# Patient Record
Sex: Female | Born: 1980 | Race: White | Hispanic: No | Marital: Married | Smoking: Never smoker
Health system: Southern US, Community
[De-identification: ages and names within clinical notes are randomized; demographics above are authoritative.]

## PROBLEM LIST (undated history)

## (undated) ENCOUNTER — Inpatient Hospital Stay (HOSPITAL_COMMUNITY): Payer: Self-pay

## (undated) DIAGNOSIS — R51 Headache: Secondary | ICD-10-CM

## (undated) DIAGNOSIS — Z8742 Personal history of other diseases of the female genital tract: Secondary | ICD-10-CM

## (undated) DIAGNOSIS — K219 Gastro-esophageal reflux disease without esophagitis: Secondary | ICD-10-CM

## (undated) DIAGNOSIS — J45909 Unspecified asthma, uncomplicated: Secondary | ICD-10-CM

## (undated) DIAGNOSIS — R519 Headache, unspecified: Secondary | ICD-10-CM

## (undated) DIAGNOSIS — I10 Essential (primary) hypertension: Secondary | ICD-10-CM

## (undated) DIAGNOSIS — E039 Hypothyroidism, unspecified: Secondary | ICD-10-CM

## (undated) DIAGNOSIS — F419 Anxiety disorder, unspecified: Secondary | ICD-10-CM

## (undated) HISTORY — PX: TYMPANOSTOMY TUBE PLACEMENT: SHX32

---

## 2013-08-28 ENCOUNTER — Other Ambulatory Visit: Payer: Self-pay | Admitting: Family Medicine

## 2013-08-28 DIAGNOSIS — R109 Unspecified abdominal pain: Secondary | ICD-10-CM

## 2013-09-03 ENCOUNTER — Other Ambulatory Visit: Payer: Self-pay

## 2013-09-20 ENCOUNTER — Ambulatory Visit
Admission: RE | Admit: 2013-09-20 | Discharge: 2013-09-20 | Disposition: A | Discharge status: Home / Self Care | Payer: 59 | Source: Ambulatory Visit | Attending: Family Medicine | Admitting: Family Medicine

## 2013-09-20 ENCOUNTER — Other Ambulatory Visit: Payer: Self-pay | Admitting: Family Medicine

## 2013-09-20 DIAGNOSIS — R319 Hematuria, unspecified: Secondary | ICD-10-CM

## 2013-09-20 DIAGNOSIS — R109 Unspecified abdominal pain: Secondary | ICD-10-CM

## 2014-10-23 ENCOUNTER — Other Ambulatory Visit: Payer: Self-pay | Admitting: Family Medicine

## 2014-10-23 DIAGNOSIS — M25561 Pain in right knee: Secondary | ICD-10-CM

## 2014-11-06 ENCOUNTER — Other Ambulatory Visit: Payer: Self-pay

## 2014-11-19 ENCOUNTER — Inpatient Hospital Stay: Admission: RE | Admit: 2014-11-19 | Payer: Self-pay | Source: Ambulatory Visit

## 2015-10-31 ENCOUNTER — Ambulatory Visit (HOSPITAL_COMMUNITY)
Admission: EM | Admit: 2015-10-31 | Discharge: 2015-10-31 | Disposition: A | Discharge status: Home / Self Care | Payer: Managed Care, Other (non HMO) | Source: Home / Self Care | Attending: Emergency Medicine | Admitting: Emergency Medicine

## 2015-10-31 ENCOUNTER — Encounter (HOSPITAL_COMMUNITY): Payer: Self-pay | Admitting: Emergency Medicine

## 2015-10-31 ENCOUNTER — Emergency Department (HOSPITAL_COMMUNITY)
Admission: EM | Admit: 2015-10-31 | Discharge: 2015-10-31 | Disposition: A | Discharge status: Home / Self Care | Payer: Managed Care, Other (non HMO) | Attending: Emergency Medicine | Admitting: Emergency Medicine

## 2015-10-31 DIAGNOSIS — M79662 Pain in left lower leg: Secondary | ICD-10-CM | POA: Diagnosis present

## 2015-10-31 DIAGNOSIS — I8392 Asymptomatic varicose veins of left lower extremity: Secondary | ICD-10-CM | POA: Insufficient documentation

## 2015-10-31 DIAGNOSIS — I8393 Asymptomatic varicose veins of bilateral lower extremities: Secondary | ICD-10-CM

## 2015-10-31 DIAGNOSIS — I8390 Asymptomatic varicose veins of unspecified lower extremity: Secondary | ICD-10-CM

## 2015-10-31 DIAGNOSIS — S86812A Strain of other muscle(s) and tendon(s) at lower leg level, left leg, initial encounter: Secondary | ICD-10-CM

## 2015-10-31 DIAGNOSIS — L550 Sunburn of first degree: Secondary | ICD-10-CM | POA: Insufficient documentation

## 2015-10-31 DIAGNOSIS — R2242 Localized swelling, mass and lump, left lower limb: Secondary | ICD-10-CM | POA: Diagnosis not present

## 2015-10-31 DIAGNOSIS — M7989 Other specified soft tissue disorders: Secondary | ICD-10-CM

## 2015-10-31 LAB — I-STAT CHEM 8, ED
BUN: 16 mg/dL (ref 6–20)
Calcium, Ion: 1.21 mmol/L (ref 1.12–1.23)
Chloride: 103 mmol/L (ref 101–111)
Creatinine, Ser: 0.9 mg/dL (ref 0.44–1.00)
Glucose, Bld: 93 mg/dL (ref 65–99)
HEMATOCRIT: 39 % (ref 36.0–46.0)
HEMOGLOBIN: 13.3 g/dL (ref 12.0–15.0)
Potassium: 3.9 mmol/L (ref 3.5–5.1)
SODIUM: 140 mmol/L (ref 135–145)
TCO2: 24 mmol/L (ref 0–100)

## 2015-10-31 LAB — POCT PREGNANCY, URINE: PREG TEST UR: NEGATIVE

## 2015-10-31 MED ORDER — ENOXAPARIN SODIUM 120 MG/0.8ML ~~LOC~~ SOLN
1.0000 mg/kg | Freq: Once | SUBCUTANEOUS | Status: AC
  Administered 2015-10-31: 115 mg via SUBCUTANEOUS
  Filled 2015-10-31: qty 0.8

## 2015-10-31 MED ORDER — IBUPROFEN 400 MG PO TABS
800.0000 mg | ORAL_TABLET | Freq: Once | ORAL | Status: AC
  Administered 2015-10-31: 800 mg via ORAL
  Filled 2015-10-31: qty 2

## 2015-10-31 MED ORDER — OXYCODONE-ACETAMINOPHEN 5-325 MG PO TABS: 1.0000 | ORAL_TABLET | ORAL | Status: DC | PRN

## 2015-10-31 MED ORDER — OXYCODONE-ACETAMINOPHEN 5-325 MG PO TABS
2.0000 | ORAL_TABLET | Freq: Once | ORAL | Status: AC
  Administered 2015-10-31: 2 via ORAL
  Filled 2015-10-31: qty 2

## 2015-10-31 NOTE — Discharge Instructions (Signed)
Deep Vein Thrombosis °A deep vein thrombosis (DVT) is a blood clot (thrombus) that usually occurs in a deep, larger vein of the lower leg or the pelvis, or in an upper extremity such as the arm. These are dangerous and can lead to serious and even life-threatening complications if the clot travels to the lungs. °A DVT can damage the valves in your leg veins so that instead of flowing upward, the blood pools in the lower leg. This is called post-thrombotic syndrome, and it can result in pain, swelling, discoloration, and sores on the leg. °CAUSES °A DVT is caused by the formation of a blood clot in your leg, pelvis, or arm. Usually, several things contribute to the formation of blood clots. A clot may develop when: °· Your blood flow slows down. °· Your vein becomes damaged in some way. °· You have a condition that makes your blood clot more easily. °RISK FACTORS °A DVT is more likely to develop in: °· People who are older, especially over 60 years of age. °· People who are overweight (obese). °· People who sit or lie still for a long time, such as during long-distance travel (over 4 hours), bed rest, hospitalization, or during recovery from certain medical conditions like a stroke. °· People who do not engage in much physical activity (sedentary lifestyle). °· People who have chronic breathing disorders. °· People who have a personal or family history of blood clots or blood clotting disease. °· People who have peripheral vascular disease (PVD), diabetes, or some types of cancer. °· People who have heart disease, especially if the person had a recent heart attack or has congestive heart failure. °· People who have neurological diseases that affect the legs (leg paresis). °· People who have had a traumatic injury, such as breaking a hip or leg. °· People who have recently had major or lengthy surgery, especially on the hip, knee, or abdomen. °· People who have had a central line placed inside a large vein. °· People  who take medicines that contain the hormone estrogen. These include birth control pills and hormone replacement therapy. °· Pregnancy or during childbirth or the postpartum period. °· Long plane flights (over 8 hours). °SIGNS AND SYMPTOMS °Symptoms of a DVT can include:  °· Swelling of your leg or arm, especially if one side is much worse. °· Warmth and redness of your leg or arm, especially if one side is much worse. °· Pain in your arm or leg. If the clot is in your leg, symptoms may be more noticeable or worse when you stand or walk. °· A feeling of pins and needles, if the clot is in the arm. °The symptoms of a DVT that has traveled to the lungs (pulmonary embolism, PE) usually start suddenly and include: °· Shortness of breath while active or at rest. °· Coughing or coughing up blood or blood-tinged mucus. °· Chest pain that is often worse with deep breaths. °· Rapid or irregular heartbeat. °· Feeling light-headed or dizzy. °· Fainting. °· Feeling anxious. °· Sweating. °There may also be pain and swelling in a leg if that is where the blood clot started. °These symptoms may represent a serious problem that is an emergency. Do not wait to see if the symptoms will go away. Get medical help right away. Call your local emergency services (911 in the [place] Do not drive yourself to the hospital. °DIAGNOSIS °Your health care provider will take a medical history and perform a physical exam. You may also   have other tests, including: °· Blood tests to assess the clotting properties of your blood. °· Imaging tests, such as CT, ultrasound, MRI, X-ray, and other tests to see if you have clots anywhere in your body. °TREATMENT °After a DVT is identified, it can be treated. The type of treatment that you receive depends on many factors, such as the cause of your DVT, your risk for bleeding or developing more clots, and other medical conditions that you have. Sometimes, a combination of treatments is necessary. Treatment  options may be combined and include: °· Monitoring the blood clot with ultrasound. °· Taking medicines by mouth, such as newer blood thinners (anticoagulants), thrombolytics, or warfarin. °· Taking anticoagulant medicine by injection or through an IV tube. °· Wearing compression stockings or using different types of devices. °· Surgery (rare) to remove the blood clot or to place a filter in your abdomen to stop the blood clot from traveling to your lungs. °Treatments for a DVT are often divided into immediate treatment and long-term treatment (up to 3 months after DVT). You can work with your health care provider to choose the treatment program that is best for you. °HOME CARE INSTRUCTIONS °If you are taking a newer oral anticoagulant: °· Take the medicine every single day at the same time each day. °· Understand what foods and drugs interact with this medicine. °· Understand that there are no regular blood tests required when using this medicine. °· Understand the side effects of this medicine, including excessive bruising or bleeding. Ask your health care provider or pharmacist about other possible side effects. °If you are taking warfarin: °· Understand how to take warfarin and know which foods can affect how warfarin works in your body. °· Understand that it is dangerous to take too much or too little warfarin. Too much warfarin increases the risk of bleeding. Too little warfarin continues to allow the risk for blood clots. °· Follow your PT and INR blood testing schedule. The PT and INR results allow your health care provider to adjust your dose of warfarin. It is very important that you have your PT and INR tested as often as told by your health care provider. °· Avoid major changes in your diet, or tell your health care provider before you change your diet. Arrange a visit with a registered dietitian to answer your questions. Many foods, especially foods that are high in vitamin K, can interfere with warfarin  and affect the PT and INR results. Eat a consistent amount of foods that are high in vitamin K, such as: °¨ Spinach, kale, broccoli, cabbage, collard greens, turnip greens, Brussels sprouts, peas, cauliflower, seaweed, and parsley. °¨ Beef liver and pork liver. °¨ Green tea. °¨ Soybean oil. °· Tell your health care provider about any and all medicines, vitamins, and supplements that you take, including aspirin and other over-the-counter anti-inflammatory medicines. Be especially cautious with aspirin and anti-inflammatory medicines. Do not take those before you ask your health care provider if it is safe to do so. This is important because many medicines can interfere with warfarin and affect the PT and INR results. °· Do not start or stop taking any over-the-counter or prescription medicine unless your health care provider or pharmacist tells you to do so. °If you take warfarin, you will also need to do these things: °· Hold pressure over cuts for longer than usual. °· Tell your dentist and other health care providers that you are taking warfarin before you have any procedures in which   bleeding may occur. °· Avoid alcohol or drink very small amounts. Tell your health care provider if you change your alcohol intake. °· Do not use tobacco products, including cigarettes, chewing tobacco, and e-cigarettes. If you need help quitting, ask your health care provider. °· Avoid contact sports. °General Instructions °· Take over-the-counter and prescription medicines only as told by your health care provider. Anticoagulant medicines can have side effects, including easy bruising and difficulty stopping bleeding. If you are prescribed an anticoagulant, you will also need to do these things: °¨ Hold pressure over cuts for longer than usual. °¨ Tell your dentist and other health care providers that you are taking anticoagulants before you have any procedures in which bleeding may occur. °¨ Avoid contact sports. °· Wear a medical  alert bracelet or carry a medical alert card that says you have had a PE. °· Ask your health care provider how soon you can go back to your normal activities. Stay active to prevent new blood clots from forming. °· Make sure to exercise while traveling or when you have been sitting or standing for a long period of time. It is very important to exercise. Exercise your legs by walking or by tightening and relaxing your leg muscles often. Take frequent walks. °· Wear compression stockings as told by your health care provider to help prevent more blood clots from forming. °· Do not use tobacco products, including cigarettes, chewing tobacco, and e-cigarettes. If you need help quitting, ask your health care provider. °· Keep all follow-up appointments with your health care provider. This is important. °PREVENTION °Take these actions to decrease your risk of developing another DVT: °· Exercise regularly. For at least 30 minutes every day, engage in: °¨ Activity that involves moving your arms and legs. °¨ Activity that encourages good blood flow through your body by increasing your heart rate. °· Exercise your arms and legs every hour during long-distance travel (over 4 hours). Drink plenty of water and avoid drinking alcohol while traveling. °· Avoid sitting or lying in bed for long periods of time without moving your legs. °· Maintain a weight that is appropriate for your height. Ask your health care provider what weight is healthy for you. °· If you are a woman who is over 35 years of age, avoid unnecessary use of medicines that contain estrogen. These include birth control pills. °· Do not smoke, especially if you take estrogen medicines. If you need help quitting, ask your health care provider. °If you are hospitalized, prevention measures may include: °· Early walking after surgery, as soon as your health care provider says that it is safe. °· Receiving anticoagulants to prevent blood clots. If you cannot take  anticoagulants, other options may be available, such as wearing compression stockings or using different types of devices. °SEEK IMMEDIATE MEDICAL CARE IF: °· You have new or increased pain, swelling, or redness in an arm or leg. °· You have numbness or tingling in an arm or leg. °· You have shortness of breath while active or at rest. °· You have chest pain. °· You have a rapid or irregular heartbeat. °· You feel light-headed or dizzy. °· You cough up blood. °· You notice blood in your vomit, bowel movement, or urine. °These symptoms may represent a serious problem that is an emergency. Do not wait to see if the symptoms will go away. Get medical help right away. Call your local emergency services (911 in the [place] Do not drive yourself to the hospital. °  °  This information is not intended to replace advice given to you by your health care provider. Make sure you discuss any questions you have with your health care provider.   Document Released: 05/23/2005 Document Revised: 02/11/2015 Document Reviewed: 09/17/2014 Elsevier Interactive Patient Education 2016 [employer] Edema Edema is an abnormal buildup of fluids in your bodytissues. Edema is somewhatdependent on gravity to pull the fluid to the lowest place in your body. That makes the condition more common in the legs and thighs (lower extremities). Painless swelling of the feet and ankles is common and becomes more likely as you get older. It is also common in looser tissues, like around your eyes.  When the affected area is squeezed, the fluid may move out of that spot and leave a dent for a few moments. This dent is called pitting.  CAUSES  There are many possible causes of edema. Eating too much salt and being on your feet or sitting for a long time can cause edema in your legs and ankles. Hot weather may make edema worse. Common medical causes of edema include:  Heart failure.  Liver disease.  Kidney disease.  Weak blood vessels in your  legs.  Cancer.  An injury.  Pregnancy.  Some medications.  Obesity. SYMPTOMS  Edema is usually painless.Your skin may look swollen or shiny.  DIAGNOSIS  Your health care provider may be able to diagnose edema by asking about your medical history and doing a physical exam. You may need to have tests such as X-rays, an electrocardiogram, or blood tests to check for medical conditions that may cause edema.  TREATMENT  Edema treatment depends on the cause. If you have heart, liver, or kidney disease, you need the treatment appropriate for these conditions. General treatment may include:  Elevation of the affected body part above the level of your heart.  Compression of the affected body part. Pressure from elastic bandages or support stockings squeezes the tissues and forces fluid back into the blood vessels. This keeps fluid from entering the tissues.  Restriction of fluid and salt intake.  Use of a water pill (diuretic). These medications are appropriate only for some types of edema. They pull fluid out of your body and make you urinate more often. This gets rid of fluid and reduces swelling, but diuretics can have side effects. Only use diuretics as directed by your health care provider. HOME CARE INSTRUCTIONS   Keep the affected body part above the level of your heart when you are lying down.   Do not sit still or stand for prolonged periods.   Do not put anything directly under your knees when lying down.  Do not wear constricting clothing or garters on your upper legs.   Exercise your legs to work the fluid back into your blood vessels. This may help the swelling go down.   Wear elastic bandages or support stockings to reduce ankle swelling as directed by your health care provider.   Eat a low-salt diet to reduce fluid if your health care provider recommends it.   Only take medicines as directed by your health care provider. SEEK MEDICAL CARE IF:   Your edema is  not responding to treatment.  You have heart, liver, or kidney disease and notice symptoms of edema.  You have edema in your legs that does not improve after elevating them.   You have sudden and unexplained weight gain. SEEK IMMEDIATE MEDICAL CARE IF:   You develop shortness of breath or chest  pain.   You cannot breathe when you lie down.  You develop pain, redness, or warmth in the swollen areas.   You have heart, liver, or kidney disease and suddenly get edema.  You have a fever and your symptoms suddenly get worse. MAKE SURE YOU:   Understand these instructions.  Will watch your condition.  Will get help right away if you are not doing well or get worse.   This information is not intended to replace advice given to you by your health care provider. Make sure you discuss any questions you have with your health care provider.   Document Released: 05/23/2005 Document Revised: 06/13/2014 Document Reviewed: 03/15/2013 Elsevier Interactive Patient Education [employer] Inc[employer]

## 2015-10-31 NOTE — ED Notes (Signed)
PA-C to see and assess patient before RN assessment. See PA note. 

## 2015-10-31 NOTE — Progress Notes (Signed)
ANTICOAGULATION CONSULT NOTE - Initial Consult  Pharmacy Consult for Lovenox Indication: r/o DVT  No Known Allergies  Patient Measurements: Height: 5\' 11"  (180.3 cm) Weight: 254 lb (115.214 kg) IBW/kg (Calculated) : 70.8  Vital Signs: Temp: 98.3 F (36.8 C) (05/27 2035) Temp Source: Oral (05/27 2035) BP: 123/83 mmHg (05/27 2035) Pulse Rate: 106 (05/27 2035)  Labs: No results for input(s): HGB, HCT, PLT, APTT, LABPROT, INR, HEPARINUNFRC, HEPRLOWMOCWT, CREATININE, CKTOTAL, CKMB, TROPONINI in the last 72 hours.  CrCl cannot be calculated (Patient has no serum creatinine result on file.).   Medical History: History reviewed. No pertinent past medical history.   Assessment: 35 y/o presents to ED with c/o new onset L leg pain.  Goal of Therapy:  INR 2-3 Anti-Xa level 0.6-1 units/ml 4hrs after LMWH dose given Monitor platelets by anticoagulation protocol: Yes   Plan:  Lovenox 115mg  SQ once tonight. Discussed with PA. Return in AM for DVT w/u.   Desiree Singh, PharmD, BCPS Clinical Staff Pharmacist Pager [phone]  Desiree Singh, Desiree Singh 10/31/2015,10:04 PM

## 2015-10-31 NOTE — ED Notes (Signed)
C/o left leg pain onset today... Reports she was playing volleyball.... Pt radiates down left leg... Pain increases w/actvity... Denies inj/trauma.... A&O x4... No acute distress.

## 2015-10-31 NOTE — ED Provider Notes (Signed)
CSN: [id number]     Arrival date & time 10/31/15  1925 History   First MD Initiated Contact with Patient 10/31/15 1954     Chief Complaint  Patient presents with  . Leg Pain   (Consider location/radiation/quality/duration/timing/severity/associated sxs/prior Treatment) HPI History obtained from patient:  Pt presents with the cc of:  Left lower leg swelling and pain Duration of symptoms: This after noon Treatment prior to arrival: None  Context: Playing volleyball Other symptoms include: Increased pain Pain score: 4 FAMILY HISTORY: No history of DVT and family.    History reviewed. No pertinent past medical history. History reviewed. No pertinent past surgical history. No family history on file. Social History  Substance Use Topics  . Smoking status: Never Smoker   . Smokeless tobacco: None  . Alcohol Use: No   OB History    No data available     Review of Systems  Denies: HEADACHE, NAUSEA, ABDOMINAL PAIN, CHEST PAIN, CONGESTION, DYSURIA, SHORTNESS OF BREATH  Allergies  Review of patient's allergies indicates no known allergies.  Home Medications   Prior to Admission medications   Not on File   Meds Ordered and Administered this Visit  Medications - No data to display  LMP 10/10/2015 No data found.   Physical Exam NURSES NOTES AND VITAL SIGNS REVIEWED. CONSTITUTIONAL: Well developed, well nourished, no acute distress HEENT: normocephalic, atraumatic EYES: Conjunctiva normal NECK:normal ROM, supple, no adenopathy PULMONARY:No respiratory distress, normal effort ABDOMINAL: Soft, ND, NT BS+, No CVAT MUSCULOSKELETAL: Normal ROM of all extremities, left lower leg she is swollen she does have several swollen varicosities noted. The knee joint itself is very stable. Calf is tender to palpation. Negative Homans sign. SKIN: warm and dry without rash PSYCHIATRIC: Mood and affect, behavior are normal  ED Course  Procedures (including critical care time)  Labs  Review Labs Reviewed  POCT PREGNANCY, URINE    Imaging Review No results found.   Visual Acuity Review  Right Eye Distance:   Left Eye Distance:   Bilateral Distance:    Right Eye Near:   Left Eye Near:    Bilateral Near:     I have discussed with patient DVT. She is concerned about the possibility of some type of vascular insult I have advised her that urgent care does not have the modality for Doppler ultrasound and if she is concerned about this issue she should just attend the emergency department. Crutches supplied to the patient. Discussion about imaging studies patient declines.  Otherwise symptomatic treatment at home including heat elevation alternating with ice and heat using crutches and ibuprofen. She is advised to return if there are new or worsening of symptoms.  MDM   1. Strain of calf muscle, left, initial encounter   2. Varicose vein of leg     Patient is reassured that there are no issues that require transfer to higher level of care at this time or additional tests. Patient is advised to continue home symptomatic treatment. Patient is advised that if there are new or worsening symptoms to attend the emergency department, contact primary care provider, or return to UC. Instructions of care provided discharged home in stable condition.    THIS NOTE WAS GENERATED USING A VOICE RECOGNITION SOFTWARE PROGRAM. ALL REASONABLE EFFORTS  WERE MADE TO PROOFREAD THIS DOCUMENT FOR ACCURACY.  I have verbally reviewed the discharge instructions with the patient. A printed AVS was given to the patient.  All questions were answered prior to discharge.  Tharon AquasFrank C Rahn Lacuesta, PA 10/31/15 2040

## 2015-10-31 NOTE — ED Notes (Signed)
The patient began to have leg pain and swelling about 1830.  She says she has been at the pool and playing volleyball outside.  The patient also said she started having a headache at the same time. She rates her pain 7/10 if you don't touch it.

## 2015-10-31 NOTE — ED Provider Notes (Signed)
CSN: [id number]     Arrival date & time 10/31/15  2029 History   First MD Initiated Contact with Patient 10/31/15 2048     Chief Complaint  Patient presents with  . Leg Pain    The patient began to have leg pain and swelling about 1830.  She says she has been at the pool and playing volleyball outside.  The patient also said she started having a headache at the same time.      (Consider location/radiation/quality/duration/timing/severity/associated sxs/prior Treatment) HPI   Patient is a 35 year old female presents to the emergency room for evaluation of left lower extremity pain and swelling with some bruising that began earlier today. She rates her pain. 10, worse with light touch and with movement, she has pain located on the outside of her left leg and also radiates to her calf and behind her knee. She does have varicose veins present that have not changed much in appearance however she does have some bruising in the left inside of her leg and a large bruise on the left outside of her leg without any known trauma. She was playing volleyball earlier today but did not have any incidents where she strained or injured her leg. She states that 2 weeks ago she did have a large bruise on the outside of her leg but it did resolve and the bruise that is there today is in the same spot.   She denies any chest pain, shortness of breath, fever. She is not on any oral birth control. She does not have any history of DVT.  No active cancer, no prolonged periods of stasis. She reports that she has spider veins and varicose veins.  History reviewed. No pertinent past medical history. History reviewed. No pertinent past surgical history. History reviewed. No pertinent family history. Social History  Substance Use Topics  . Smoking status: Never Smoker   . Smokeless tobacco: None  . Alcohol Use: No   OB History    No data available     Review of Systems  All other systems reviewed and are  negative.     Allergies  Review of patient's allergies indicates no known allergies.  Home Medications   Prior to Admission medications   Medication Sig Start Date End Date Taking? Authorizing Provider  oxyCODONE-acetaminophen (PERCOCET) 5-325 MG tablet Take 1 tablet by mouth every 4 (four) hours as needed. 10/31/15   Danelle Berry, PA-C   BP 106/92 mmHg  Pulse 86  Temp(Src) 98.3 F (36.8 C) (Oral)  Resp 18  Ht  (1.803 m)  Wt 115.214 kg  BMI 35.44 kg/m2  SpO2 97%  LMP 10/10/2015 Physical Exam  Constitutional: She is oriented to person, place, and time. She appears well-developed and well-nourished. No distress.  HENT:  Head: Normocephalic and atraumatic.  Right Ear: External ear normal.  Left Ear: External ear normal.  Nose: Nose normal.  Mouth/Throat: Oropharynx is clear and moist. No oropharyngeal exudate.  Eyes: Conjunctivae and EOM are normal. Pupils are equal, round, and reactive to light. Right eye exhibits no discharge. Left eye exhibits no discharge. No scleral icterus.  Neck: Normal range of motion. Neck supple. No JVD present. No tracheal deviation present.  Cardiovascular: Normal rate and regular rhythm.   Pulmonary/Chest: Effort normal and breath sounds normal. No stridor. No respiratory distress. She has no wheezes. She has no rales.  Musculoskeletal: Normal range of motion. She exhibits edema and tenderness.  Left lower extremity edematous over anterior upper half of  shin with tenderness to very light palpation across the same area extending into left calf and left popliteal fossa Nonthrombosed varicose veins in the left medial leg, just superior to varicose veins presence of bruising which is tender.  Left lateral leg large swollen bruised area, tender to palpation Normal range of motion of ankle Normal range of motion of left knee  Lymphadenopathy:    She has no cervical adenopathy.  Neurological: She is alert and oriented to person, place, and time. She  exhibits normal muscle tone. Coordination normal.  Skin: Skin is warm and dry. No rash noted. She is not diaphoretic. There is erythema. No pallor.  Erythematous skin to arms, chest and legs (sunburn) no rash Bilateral calves measured L>R by 1.5-2 cm  Psychiatric: She has a normal mood and affect. Her behavior is normal. Judgment and thought content normal.  Nursing note and vitals reviewed.   ED Course  Procedures (including critical care time) Labs Review Labs Reviewed  I-STAT CHEM 8, ED    Imaging Review No results found. I have personally reviewed and evaluated these images and lab results as part of my medical decision-making.   EKG Interpretation None      MDM   Pt with left leg swelling with pain, LLE calf Circumference greater than right, tenderness along the anterior lateral leg extending into the calf and popliteal fossa. She has several areas of bruising, swelling and varicose veins. No recent trauma, no injury or strain. Patient was seen late at night in the ER, without vascular lab available. Felt that the patient would likely have a positive d-dimer given the bruising visible on her skin, also she had a large bruised area 2 weeks ago. I discussed d-dimer testing with her to rule out versus leaving earlier tonight and coming back in the morning for a vascular study. Patient wished to discharge home and come back in the morning for further testing. A chem 8 was obtained to evaluate kidney function. She was given Lovenox and third instructions regarding return tomorrow.  She is given pain medicines in the ER prescription to go home with.  She denies any signs or symptoms concerning for PE, was hemodynamically stable while in the ER, no chest pain or shortness of breath. I reviewed with the patient what to do tomorrow including if tested was negative she could go home without any further treatment and if it is positive she'll need be seen by a provider to be prescribed  anticoagulation.  Filed Vitals:   10/31/15 2035 10/31/15 2152 10/31/15 2209  BP: 123/83  106/92  Pulse: 106  86  Temp: 98.3 F (36.8 C)    TempSrc: Oral    Resp: 16  18  Height: 5\' 11"  (1.803 m)    Weight: 104.327 kg 115.214 kg   SpO2: 99%  97%     Final diagnoses:  Swelling of left lower extremity      Danelle BerryLeisa Kaven Cumbie, PA-C 10/31/15 2347  Laurence Spatesachel Morgan Little, MD 11/02/15 [phone]

## 2015-10-31 NOTE — ED Notes (Signed)
Patient verbalized understanding of discharge instructions and denies any further needs or questions at this time. VS stable. Patient ambulatory with steady gait, assisted patient to ED entrance in wheelchair.

## 2015-10-31 NOTE — Discharge Instructions (Signed)
Cryotherapy Cryotherapy is when you put ice on your injury. Ice helps lessen pain and puffiness (swelling) after an injury. Ice works the best when you start using it in the first 24 to 48 hours after an injury. HOME CARE  Put a dry or damp towel between the ice pack and your skin.  You may press gently on the ice pack.  Leave the ice on for no more than 10 to 20 minutes at a time.  Check your skin after 5 minutes to make sure your skin is okay.  Rest at least 20 minutes between ice pack uses.  Stop using ice when your skin loses feeling (numbness).  Do not use ice on someone who cannot tell you when it hurts. This includes small children and people with memory problems (dementia). GET HELP RIGHT AWAY IF:  You have white spots on your skin.  Your skin turns blue or pale.  Your skin feels waxy or hard.  Your puffiness gets worse. MAKE SURE YOU:   Understand these instructions.  Will watch your condition.  Will get help right away if you are not doing well or get worse.   This information is not intended to replace advice given to you by your health care provider. Make sure you discuss any questions you have with your health care provider.   Document Released: 11/09/2007 Document Revised: 08/15/2011 Document Reviewed: 01/13/2011 Elsevier Interactive Patient Education 2016 Elsevier Inc[employer] C Varicose Veins Varicose veins are veins that have become enlarged and twisted. They are usually seen in the legs but can occur in other parts of the body as well. CAUSES This condition is the result of valves in the veins not working properly. Valves in the veins help to return blood from the leg to the heart. If these valves are damaged, blood flows backward and backs up into the veins in the leg near the skin. This causes the veins to become larger. RISK FACTORS People who are on their feet a lot, who are pregnant, or who are overweight are more likely to develop varicose veins. SIGNS  AND SYMPTOMS  Bulging, twisted-appearing, bluish veins, most commonly found on the legs.  Leg pain or a feeling of heaviness. These symptoms may be worse at the end of the day.  Leg swelling.  Changes in skin color. DIAGNOSIS A health care provider can usually diagnose varicose veins by examining your legs. Your health care provider may also recommend an ultrasound of your leg veins. TREATMENT Most varicose veins can be treated at home.However, other treatments are available for people who have persistent symptoms or want to improve the cosmetic appearance of the varicose veins. These treatment options include:  Sclerotherapy. A solution is injected into the vein to close it off.  Laser treatment. A laser is used to heat the vein to close it off.  Radiofrequency vein ablation. An electrical current produced by radio waves is used to close off the vein.  Phlebectomy. The vein is surgically removed through small incisions made over the varicose vein.  Vein ligation and stripping. The vein is surgically removed through incisions made over the varicose vein after the vein has been tied (ligated). HOME CARE INSTRUCTIONS  Do not stand or sit in one position for long periods of time. Do not sit with your legs crossed. Rest with your legs raised during the day.  Wear compression stockings as directed by your health care provider. These stockings help to prevent blood clots and reduce swelling in your  legs.  Do not wear other tight, encircling garments around your legs, pelvis, or waist.  Walk as much as possible to increase blood flow.  Raise the foot of your bed at night with 2-inch blocks.  If you get a cut in the skin over the vein and the vein bleeds, lie down with your leg raised and press on it with a clean cloth until the bleeding stops. Then place a bandage (dressing) on the cut. See your health care provider if it continues to bleed. SEEK MEDICAL CARE IF:  The skin around your  ankle starts to break down.  You have pain, redness, tenderness, or hard swelling in your leg over a vein.  You are uncomfortable because of leg pain.   This information is not intended to replace advice given to you by your health care provider. Make sure you discuss any questions you have with your health care provider.   Document Released: 03/02/2005 Document Revised: 06/13/2014 Document Reviewed: 10/08/2013 Elsevier Interactive Patient Education 2016 [employer]. Crutch Use Crutches take weight off one of your legs or feet when you stand or walk. It is important to use crutches that fit right. Your crutches fit right if:  You can fit 2-3 fingers between your armpit and the crutch.  You use your hands, not your armpits, to hold yourself up. Do not put your armpits on the crutches. This can damage the nerves in your hands and arms. Crutches should be a little below your armpits. HOW TO USE YOUR CRUTCHES Walking  Step with the crutches.  Swing the good leg a little bit in front of the crutches. Going Up Steps If there is no handrail:  Step up with the good leg.  Step up with the crutches and hurt leg.  Continue in this way. If there is a handrail:  Hold both crutches in one hand.  Place your free hand on the handrail.  Put your weight on your arms and lift your good leg to the step.  Bring the crutches and the hurt leg up to that step.  Continue in this way. Going Down Steps Be very careful, as going down stairs with crutches is very challenging. If there is no handrail: 1. Step down with the hurt leg and crutches. 2. Step down with the good leg. If there is a handrail: 1. Place your hand on the handrail. 2. Hold both crutches with your free hand. 3. Lower your hurt leg and crutch to the step below you. Make sure to keep the crutch tips in the center of the step, never on the edge. 4. Lower your good leg to that step. 5. Continue in this way. Standing  Up 1. Hold the hurt leg forward. 2. Grab the armrest with one hand and the top of the crutches with the other hand. 3. Pull yourself up to a standing position. Sitting Down 1. Hold the hurt leg forward. 2. Grab the armrest with one hand and the top of the crutches with the other hand. 3.  Lower yourself to a sitting position. GET HELP IF:  You still feel wobbly on your feet.  You develop new pain, for example in your armpits, back, shoulder, wrist, or hip.  You cannot feel a part of your body (numb).  You have tingling. GET HELP RIGHT AWAY IF: You fall.   This information is not intended to replace advice given to you by your health care provider. Make sure you discuss any questions you have  with your health care provider.   Document Released: 11/09/2007 Document Revised: 03/13/2013 Document Reviewed: 01/28/2013 Elsevier Interactive Patient Education 2016 Elsevier Inc. [employer] LockerHeat Therapy Heat therapy can help ease sore, stiff, injured, and tight muscles and joints. Heat relaxes your muscles, which may help ease your pain.  RISKS AND COMPLICATIONS If you have any of the following conditions, do not use heat therapy unless your health care provider has approved:  Poor circulation.  Healing wounds or scarred skin in the area being treated.  Diabetes, heart disease, or high blood pressure.  Not being able to feel (numbness) the area being treated.  Unusual swelling of the area being treated.  Active infections.  Blood clots.  Cancer.  Inability to communicate pain. This may include young children and people who have problems with their brain function (dementia).  Pregnancy. Heat therapy should only be used on old, pre-existing, or long-lasting (chronic) injuries. Do not use heat therapy on new injuries unless directed by your health care provider. HOW TO USE HEAT THERAPY There are several different kinds of heat therapy, including:  Moist heat pack.  Warm water  bath.  Hot water bottle.  Electric heating pad.  Heated gel pack.  Heated wrap.  Electric heating pad. Use the heat therapy method suggested by your health care provider. Follow your health care provider's instructions on when and how to use heat therapy. GENERAL HEAT THERAPY RECOMMENDATIONS  Do not sleep while using heat therapy. Only use heat therapy while you are awake.  Your skin may turn pink while using heat therapy. Do not use heat therapy if your skin turns red.  Do not use heat therapy if you have new pain.  High heat or long exposure to heat can cause burns. Be careful when using heat therapy to avoid burning your skin.  Do not use heat therapy on areas of your skin that are already irritated, such as with a rash or sunburn. SEEK MEDICAL CARE IF:  You have blisters, redness, swelling, or numbness.  You have new pain.  Your pain is worse. MAKE SURE YOU: 3. Understand these instructions. 4. Will watch your condition. 5. Will get help right away if you are not doing well or get worse.   This information is not intended to replace advice given to you by your health care provider. Make sure you discuss any questions you have with your health care provider.   Document Released: 08/15/2011 Document Revised: 06/13/2014 Document Reviewed: 07/16/2013 Elsevier Interactive Patient Education [employer] Elsevier Inc.

## 2015-11-01 ENCOUNTER — Ambulatory Visit (HOSPITAL_COMMUNITY)
Admission: RE | Admit: 2015-11-01 | Discharge: 2015-11-01 | Disposition: A | Discharge status: Home / Self Care | Payer: Managed Care, Other (non HMO) | Source: Ambulatory Visit | Attending: Emergency Medicine | Admitting: Emergency Medicine

## 2015-11-01 ENCOUNTER — Other Ambulatory Visit (HOSPITAL_COMMUNITY): Payer: Self-pay | Admitting: Family Medicine

## 2015-11-01 DIAGNOSIS — R52 Pain, unspecified: Secondary | ICD-10-CM | POA: Diagnosis not present

## 2015-11-01 NOTE — Progress Notes (Signed)
VASCULAR LAB PRELIMINARY  PRELIMINARY  PRELIMINARY  PRELIMINARY  Left lower extremity venous duplex completed.    Preliminary report:  There is no DVT or SVT noted in the left lower extremity.  Varicose veins noted, but they are not thrombosed.  Trusten Hume, RVT 11/01/2015, 8:49 AM

## 2016-06-28 ENCOUNTER — Encounter (HOSPITAL_COMMUNITY): Payer: Self-pay | Admitting: Emergency Medicine

## 2016-06-28 ENCOUNTER — Emergency Department (HOSPITAL_COMMUNITY)
Admission: EM | Admit: 2016-06-28 | Discharge: 2016-06-28 | Disposition: A | Discharge status: Home / Self Care | Payer: BLUE CROSS/BLUE SHIELD | Attending: Emergency Medicine | Admitting: Emergency Medicine

## 2016-06-28 DIAGNOSIS — Z79899 Other long term (current) drug therapy: Secondary | ICD-10-CM | POA: Diagnosis not present

## 2016-06-28 DIAGNOSIS — E860 Dehydration: Secondary | ICD-10-CM | POA: Insufficient documentation

## 2016-06-28 DIAGNOSIS — I10 Essential (primary) hypertension: Secondary | ICD-10-CM | POA: Insufficient documentation

## 2016-06-28 DIAGNOSIS — R112 Nausea with vomiting, unspecified: Secondary | ICD-10-CM | POA: Diagnosis present

## 2016-06-28 HISTORY — DX: Essential (primary) hypertension: I10

## 2016-06-28 LAB — URINALYSIS, ROUTINE W REFLEX MICROSCOPIC
Bacteria, UA: NONE SEEN
Bilirubin Urine: NEGATIVE
Glucose, UA: NEGATIVE mg/dL
KETONES UR: NEGATIVE mg/dL
Leukocytes, UA: NEGATIVE
Nitrite: NEGATIVE
PROTEIN: NEGATIVE mg/dL
Specific Gravity, Urine: 1.023 (ref 1.005–1.030)
pH: 5 (ref 5.0–8.0)

## 2016-06-28 LAB — COMPREHENSIVE METABOLIC PANEL
ALBUMIN: 4 g/dL (ref 3.5–5.0)
ALT: 30 U/L (ref 14–54)
AST: 25 U/L (ref 15–41)
Alkaline Phosphatase: 53 U/L (ref 38–126)
Anion gap: 9 (ref 5–15)
BUN: 16 mg/dL (ref 6–20)
CHLORIDE: 103 mmol/L (ref 101–111)
CO2: 25 mmol/L (ref 22–32)
CREATININE: 0.9 mg/dL (ref 0.44–1.00)
Calcium: 9.2 mg/dL (ref 8.9–10.3)
GFR calc non Af Amer: >60 mL/min (ref >60)
Glucose, Bld: 112 mg/dL — ABNORMAL HIGH (ref 65–99)
Potassium: 3.5 mmol/L (ref 3.5–5.1)
SODIUM: 137 mmol/L (ref 135–145)
Total Bilirubin: 0.7 mg/dL (ref 0.3–1.2)
Total Protein: 7.5 g/dL (ref 6.5–8.1)

## 2016-06-28 LAB — I-STAT BETA HCG BLOOD, ED (MC, WL, AP ONLY)

## 2016-06-28 LAB — CBC
HCT: 42.7 % (ref 36.0–46.0)
Hemoglobin: 14.7 g/dL (ref 12.0–15.0)
MCH: 29.5 pg (ref 26.0–34.0)
MCHC: 34.4 g/dL (ref 30.0–36.0)
MCV: 85.7 fL (ref 78.0–100.0)
PLATELETS: 216 10*3/uL (ref 150–400)
RBC: 4.98 MIL/uL (ref 3.87–5.11)
RDW: 13 % (ref 11.5–15.5)
WBC: 10.7 10*3/uL — ABNORMAL HIGH (ref 4.0–10.5)

## 2016-06-28 LAB — LIPASE, BLOOD: LIPASE: 17 U/L (ref 11–51)

## 2016-06-28 MED ORDER — KETOROLAC TROMETHAMINE 30 MG/ML IJ SOLN
30.0000 mg | Freq: Once | INTRAMUSCULAR | Status: AC
  Administered 2016-06-28: 30 mg via INTRAVENOUS

## 2016-06-28 MED ORDER — ONDANSETRON 4 MG PO TBDP: 4.0000 mg | ORAL_TABLET | Freq: Once | ORAL | Status: DC

## 2016-06-28 MED ORDER — PANTOPRAZOLE SODIUM 40 MG IV SOLR
40.0000 mg | Freq: Once | INTRAVENOUS | Status: AC
  Administered 2016-06-28: 40 mg via INTRAVENOUS

## 2016-06-28 MED ORDER — HYDROMORPHONE HCL 2 MG/ML IJ SOLN
1.0000 mg | Freq: Once | INTRAMUSCULAR | Status: AC
Start: 2016-06-28 — End: 2016-06-28
  Administered 2016-06-28: 1 mg via INTRAVENOUS
  Filled 2016-06-28: qty 1

## 2016-06-28 MED ORDER — SODIUM CHLORIDE 0.9 % IV BOLUS (SEPSIS)
1000.0000 mL | Freq: Once | INTRAVENOUS | Status: AC
  Administered 2016-06-28: 1000 mL via INTRAVENOUS

## 2016-06-28 MED ORDER — SODIUM CHLORIDE 0.9 % IV BOLUS (SEPSIS)
2000.0000 mL | Freq: Once | INTRAVENOUS | Status: AC
  Administered 2016-06-28: 2000 mL via INTRAVENOUS

## 2016-06-28 MED ORDER — DICYCLOMINE HCL 20 MG PO TABS: ORAL_TABLET | ORAL | 0 refills | Status: DC

## 2016-06-28 MED ORDER — ONDANSETRON 4 MG PO TBDP
ORAL_TABLET | ORAL | Status: AC
  Filled 2016-06-28: qty 1

## 2016-06-28 MED ORDER — ONDANSETRON HCL 4 MG/2ML IJ SOLN
4.0000 mg | Freq: Once | INTRAMUSCULAR | Status: AC
  Administered 2016-06-28: 4 mg via INTRAVENOUS

## 2016-06-28 MED ORDER — ONDANSETRON 4 MG PO TBDP: ORAL_TABLET | ORAL | 0 refills | Status: DC

## 2016-06-28 MED ORDER — PANTOPRAZOLE SODIUM 20 MG PO TBEC: 20.0000 mg | DELAYED_RELEASE_TABLET | Freq: Every day | ORAL | 0 refills | Status: DC

## 2016-06-28 NOTE — ED Triage Notes (Signed)
N/v/d  Since 4 am and she is dizzy now

## 2016-06-28 NOTE — ED Notes (Signed)
Patient ambulatory to room with steady gait.

## 2016-06-28 NOTE — ED Provider Notes (Signed)
[facility]-EMERGENCY DEPT Provider Note   CSN: [id number] Arrival date & time: 06/28/16  1428     History   Chief Complaint Chief Complaint  Patient presents with  . Emesis    HPI Desiree Singh is a 36 y.o. female.  Patient states that she's been having nausea vomiting abdominal cramps and diarrhea. No blood in her diarrhea. Patient does state after she vomited a number of times she had some blood in her vomit   The history is provided by the patient. No language interpreter was used.  Emesis   This is a new problem. The current episode started 12 to 24 hours ago. The problem occurs continuously. The problem has not changed since onset.The emesis has an appearance of stomach contents and bright red blood. There has been no fever. Associated symptoms include diarrhea. Pertinent negatives include no abdominal pain, no chills, no cough and no headaches.    Past Medical History:  Diagnosis Date  . Hypertension     There are no active problems to display for this patient.   History reviewed. No pertinent surgical history.  OB History    No data available       Home Medications    Prior to Admission medications   Medication Sig Start Date End Date Taking? Authorizing Provider  dicyclomine (BENTYL) 20 MG tablet Take one every 6 hours as needed for abdominal cramps 06/28/16   Bethann Berkshire, MD  ondansetron (ZOFRAN ODT) 4 MG disintegrating tablet 4mg  ODT q4 hours prn nausea/vomit 06/28/16   Bethann Berkshire, MD  oxyCODONE-acetaminophen (PERCOCET) 5-325 MG tablet Take 1 tablet by mouth every 4 (four) hours as needed. 10/31/15   Danelle Berry, PA-C  pantoprazole (PROTONIX) 20 MG tablet Take 1 tablet (20 mg total) by mouth daily. 06/28/16   Bethann Berkshire, MD    Family History No family history on file.  Social History Social History  Substance Use Topics  . Smoking status: Never Smoker  . Smokeless tobacco: Not on file  . Alcohol use No     Allergies   Patient has no known  allergies.   Review of Systems Review of Systems  Constitutional: Negative for appetite change, chills and fatigue.  HENT: Negative for congestion, ear discharge and sinus pressure.   Eyes: Negative for discharge.  Respiratory: Negative for cough.   Cardiovascular: Negative for chest pain.  Gastrointestinal: Positive for diarrhea and vomiting. Negative for abdominal pain.  Genitourinary: Negative for frequency and hematuria.  Musculoskeletal: Negative for back pain.  Skin: Negative for rash.  Neurological: Negative for seizures and headaches.  Psychiatric/Behavioral: Negative for hallucinations.     Physical Exam Updated Vital Signs BP 116/70 (BP Location: Left Arm)   Pulse 89   Temp 98.4 F (36.9 C) (Oral)   Resp 18   SpO2 97%   Physical Exam  Constitutional: She is oriented to person, place, and time. She appears well-developed.  HENT:  Head: Normocephalic.  Mucous membranes dry  Eyes: Conjunctivae and EOM are normal. No scleral icterus.  Neck: Neck supple. No thyromegaly present.  Cardiovascular: Normal rate and regular rhythm.  Exam reveals no gallop and no friction rub.   No murmur heard. Pulmonary/Chest: No stridor. She has no wheezes. She has no rales. She exhibits no tenderness.  Abdominal: She exhibits no distension. There is tenderness. There is no rebound.  Mild tenderness throughout  Musculoskeletal: Normal range of motion. She exhibits no edema.  Lymphadenopathy:    She has no cervical adenopathy.  Neurological: She  is oriented to person, place, and time. She exhibits normal muscle tone. Coordination normal.  Skin: No rash noted. No erythema.  Psychiatric: She has a normal mood and affect. Her behavior is normal.     ED Treatments / Results  Labs (all labs ordered are listed, but only abnormal results are displayed) Labs Reviewed  COMPREHENSIVE METABOLIC PANEL - Abnormal; Notable for the following:       Result Value   Glucose, Bld 112 (*)    All  other components within normal limits  CBC - Abnormal; Notable for the following:    WBC 10.7 (*)    All other components within normal limits  URINALYSIS, ROUTINE W REFLEX MICROSCOPIC - Abnormal; Notable for the following:    Hgb urine dipstick MODERATE (*)    Squamous Epithelial / LPF 0-5 (*)    All other components within normal limits  LIPASE, BLOOD  I-STAT BETA HCG BLOOD, ED ([facility], [facility], [facility] ONLY)    EKG  EKG Interpretation None       Radiology No results found.  Procedures Procedures (including critical care time)  Medications Ordered in ED Medications  ondansetron (ZOFRAN-ODT) disintegrating tablet 4 mg (not administered)  ondansetron (ZOFRAN-ODT) 4 MG disintegrating tablet (not administered)  sodium chloride 0.9 % bolus 2,000 mL (0 mLs Intravenous Stopped 06/28/16 1949)  ketorolac (TORADOL) 30 MG/ML injection 30 mg (30 mg Intravenous Given 06/28/16 1700)  pantoprazole (PROTONIX) injection 40 mg (40 mg Intravenous Given 06/28/16 1700)  ondansetron (ZOFRAN) injection 4 mg (4 mg Intravenous Given 06/28/16 1700)  HYDROmorphone (DILAUDID) injection 1 mg (1 mg Intravenous Given 06/28/16 1840)  sodium chloride 0.9 % bolus 1,000 mL (0 mLs Intravenous Stopped 06/28/16 2126)     Initial Impression / Assessment and Plan / ED Course  I have reviewed the triage vital signs and the nursing notes.  Pertinent labs & imaging results that were available during my care of the patient were reviewed by me and considered in my medical decision making (see chart for details).    Patient with gastroenteritis and dehydration. Patient improved with 3 L of IV normal saline. She is sent home with Bentyl protonic Zofran and will follow-up with her PCP if not improving  Final Clinical Impressions(s) / ED Diagnoses   Final diagnoses:  Dehydration    New Prescriptions New Prescriptions   DICYCLOMINE (BENTYL) 20 MG TABLET    Take one every 6 hours as needed for abdominal cramps   ONDANSETRON  (ZOFRAN ODT) 4 MG DISINTEGRATING TABLET    4mg  ODT q4 hours prn nausea/vomit   PANTOPRAZOLE (PROTONIX) 20 MG TABLET    Take 1 tablet (20 mg total) by mouth daily.     Bethann BerkshireJoseph Yanique Mulvihill, MD 06/28/16 2137

## 2016-06-28 NOTE — Discharge Instructions (Signed)
Drink plenty of fluids take Tylenol or Motrin for aches and follow-up with her doctor if not improving

## 2016-06-30 ENCOUNTER — Other Ambulatory Visit: Payer: Self-pay | Admitting: Family Medicine

## 2016-06-30 DIAGNOSIS — R519 Headache, unspecified: Secondary | ICD-10-CM

## 2016-06-30 DIAGNOSIS — R51 Headache: Principal | ICD-10-CM

## 2016-07-06 ENCOUNTER — Ambulatory Visit
Admission: RE | Admit: 2016-07-06 | Discharge: 2016-07-06 | Disposition: A | Discharge status: Home / Self Care | Payer: BLUE CROSS/BLUE SHIELD | Source: Ambulatory Visit | Attending: Family Medicine | Admitting: Family Medicine

## 2016-07-06 DIAGNOSIS — R51 Headache: Principal | ICD-10-CM

## 2016-07-06 DIAGNOSIS — R519 Headache, unspecified: Secondary | ICD-10-CM

## 2017-01-09 LAB — OB RESULTS CONSOLE RPR: RPR: NONREACTIVE

## 2017-01-09 LAB — OB RESULTS CONSOLE ABO/RH: RH TYPE: POSITIVE

## 2017-01-09 LAB — OB RESULTS CONSOLE GC/CHLAMYDIA
Chlamydia: NEGATIVE
Gonorrhea: NEGATIVE

## 2017-01-09 LAB — OB RESULTS CONSOLE HEPATITIS B SURFACE ANTIGEN: Hepatitis B Surface Ag: NEGATIVE

## 2017-01-09 LAB — OB RESULTS CONSOLE RUBELLA ANTIBODY, IGM: RUBELLA: IMMUNE

## 2017-01-09 LAB — OB RESULTS CONSOLE HIV ANTIBODY (ROUTINE TESTING): HIV: NONREACTIVE

## 2017-01-09 LAB — OB RESULTS CONSOLE ANTIBODY SCREEN: Antibody Screen: NEGATIVE

## 2017-02-19 ENCOUNTER — Inpatient Hospital Stay (HOSPITAL_COMMUNITY)
Admission: AD | Admit: 2017-02-19 | Discharge: 2017-02-19 | Disposition: A | Discharge status: Home / Self Care | Payer: BLUE CROSS/BLUE SHIELD | Source: Ambulatory Visit | Attending: Obstetrics and Gynecology | Admitting: Obstetrics and Gynecology

## 2017-02-19 ENCOUNTER — Inpatient Hospital Stay (HOSPITAL_COMMUNITY): Payer: BLUE CROSS/BLUE SHIELD

## 2017-02-19 ENCOUNTER — Encounter (HOSPITAL_COMMUNITY): Payer: Self-pay | Admitting: *Deleted

## 2017-02-19 DIAGNOSIS — O209 Hemorrhage in early pregnancy, unspecified: Secondary | ICD-10-CM | POA: Diagnosis present

## 2017-02-19 DIAGNOSIS — O4692 Antepartum hemorrhage, unspecified, second trimester: Secondary | ICD-10-CM | POA: Diagnosis not present

## 2017-02-19 DIAGNOSIS — O162 Unspecified maternal hypertension, second trimester: Secondary | ICD-10-CM | POA: Diagnosis not present

## 2017-02-19 DIAGNOSIS — O26852 Spotting complicating pregnancy, second trimester: Secondary | ICD-10-CM

## 2017-02-19 DIAGNOSIS — Z79899 Other long term (current) drug therapy: Secondary | ICD-10-CM | POA: Insufficient documentation

## 2017-02-19 DIAGNOSIS — O09522 Supervision of elderly multigravida, second trimester: Secondary | ICD-10-CM | POA: Insufficient documentation

## 2017-02-19 DIAGNOSIS — Z79891 Long term (current) use of opiate analgesic: Secondary | ICD-10-CM | POA: Insufficient documentation

## 2017-02-19 DIAGNOSIS — Z3A14 14 weeks gestation of pregnancy: Secondary | ICD-10-CM

## 2017-02-19 LAB — URINALYSIS, ROUTINE W REFLEX MICROSCOPIC
BILIRUBIN URINE: NEGATIVE
Glucose, UA: NEGATIVE mg/dL
KETONES UR: NEGATIVE mg/dL
Nitrite: NEGATIVE
Protein, ur: NEGATIVE mg/dL
Specific Gravity, Urine: 1.02 (ref 1.005–1.030)
pH: 7 (ref 5.0–8.0)

## 2017-02-19 LAB — CBC
HCT: 36.8 % (ref 36.0–46.0)
Hemoglobin: 12.6 g/dL (ref 12.0–15.0)
MCH: 29.6 pg (ref 26.0–34.0)
MCHC: 34.2 g/dL (ref 30.0–36.0)
MCV: 86.6 fL (ref 78.0–100.0)
PLATELETS: 158 10*3/uL (ref 150–400)
RBC: 4.25 MIL/uL (ref 3.87–5.11)
RDW: 13.8 % (ref 11.5–15.5)
WBC: 10.4 10*3/uL (ref 4.0–10.5)

## 2017-02-19 LAB — WET PREP, GENITAL
CLUE CELLS WET PREP: NONE SEEN
Trich, Wet Prep: NONE SEEN
YEAST WET PREP: NONE SEEN

## 2017-02-19 NOTE — MAU Provider Note (Signed)
Chief Complaint: Vaginal Bleeding   None     SUBJECTIVE HPI: Desiree Singh is a 36 y.o. G3P1010 at [redacted]w[redacted]d by LMP who presents to maternity admissions reporting vaginal spotting when wiping today. It is associated with mild cramping.  She had intercourse earlier today, no other changes in activity. Her pregnancy has been uncomplicated.  There are no other associated symptoms. She has not tried any treatments. She denies vaginal itching/burning, urinary symptoms, h/a, dizziness, n/v, or fever/chills.     HPI  Past Medical History:  Diagnosis Date  . Hypertension    History reviewed. No pertinent surgical history. Social History   Social History  . Marital status: Single    Spouse name: N/A  . Number of children: N/A  . Years of education: N/A   Occupational History  . Not on file.   Social History Main Topics  . Smoking status: Never Smoker  . Smokeless tobacco: Never Used  . Alcohol use No  . Drug use: No  . Sexual activity: Yes    Birth control/ protection: None   Other Topics Concern  . Not on file   Social History Narrative  . No narrative on file   No current facility-administered medications on file prior to encounter.    Current Outpatient Prescriptions on File Prior to Encounter  Medication Sig Dispense Refill  . dicyclomine (BENTYL) 20 MG tablet Take one every 6 hours as needed for abdominal cramps 20 tablet 0  . ondansetron (ZOFRAN ODT) 4 MG disintegrating tablet  ODT q4 hours prn nausea/vomit 12 tablet 0  . oxyCODONE-acetaminophen (PERCOCET) 5-325 MG tablet Take 1 tablet by mouth every 4 (four) hours as needed. 10 tablet 0  . pantoprazole (PROTONIX) 20 MG tablet Take 1 tablet (20 mg total) by mouth daily. 20 tablet 0   No Known Allergies  ROS:  Review of Systems  Constitutional: Negative for chills, fatigue and fever.  Respiratory: Negative for shortness of breath.   Cardiovascular: Negative for chest pain.  Gastrointestinal: Negative for nausea  and vomiting.  Genitourinary: Positive for pelvic pain and vaginal bleeding. Negative for difficulty urinating, dysuria, flank pain, vaginal discharge and vaginal pain.  Neurological: Negative for dizziness and headaches.  Psychiatric/Behavioral: Negative.      I have reviewed patient's Past Medical Hx, Surgical Hx, Family Hx, Social Hx, medications and allergies.   Physical Exam   Patient Vitals for the past 24 hrs:  BP Temp Temp src Pulse Resp SpO2  02/19/17 1819 132/80 97.9 F (36.6 C) Oral 96 18 100 %   Constitutional: Well-developed, well-nourished female in no acute distress.  Cardiovascular: normal rate Respiratory: normal effort GI: Abd soft, non-tender. Pos BS x 4 MS: Extremities nontender, no edema, normal ROM Neurologic: Alert and oriented x 4.  GU: Neg CVAT.  PELVIC EXAM: Cervix pink, visually closed, without lesion, scant light pink discharge, vaginal walls and external genitalia normal  FHT 155 by doppler  LAB RESULTS Results for orders placed or performed during the hospital encounter of 02/19/17 (from the past 24 hour(s))  Urinalysis, Routine w reflex microscopic     Status: Abnormal   Collection Time: 02/19/17  6:13 PM  Result Value Ref Range   Color, Urine YELLOW YELLOW   APPearance HAZY (A) CLEAR   Specific Gravity, Urine 1.020 1.005 - 1.030   pH 7.0 5.0 - 8.0   Glucose, UA NEGATIVE NEGATIVE mg/dL   Hgb urine dipstick MODERATE (A) NEGATIVE   Bilirubin Urine NEGATIVE NEGATIVE   Ketones, ur  NEGATIVE NEGATIVE mg/dL   Protein, ur NEGATIVE NEGATIVE mg/dL   Nitrite NEGATIVE NEGATIVE   Leukocytes, UA SMALL (A) NEGATIVE   RBC / HPF 0-5 0 - 5 RBC/hpf   WBC, UA 6-30 0 - 5 WBC/hpf   Bacteria, UA RARE (A) NONE SEEN   Squamous Epithelial / LPF 0-5 (A) NONE SEEN   Mucus PRESENT    Sperm, UA PRESENT   Wet prep, genital     Status: Abnormal   Collection Time: 02/19/17  7:22 PM  Result Value Ref Range   Yeast Wet Prep HPF POC NONE SEEN NONE SEEN   Trich, Wet  Prep NONE SEEN NONE SEEN   Clue Cells Wet Prep HPF POC NONE SEEN NONE SEEN   WBC, Wet Prep HPF POC MANY (A) NONE SEEN   Sperm PRESENT   CBC     Status: None   Collection Time: 02/19/17  7:54 PM  Result Value Ref Range   WBC 10.4 4.0 - 10.5 K/uL   RBC 4.25 3.87 - 5.11 MIL/uL   Hemoglobin 12.6 12.0 - 15.0 g/dL   HCT 16.1 09.6 - 04.5 %   MCV 86.6 78.0 - 100.0 fL   MCH 29.6 26.0 - 34.0 pg   MCHC 34.2 30.0 - 36.0 g/dL   RDW 40.9 81.1 - 91.4 %   Platelets 158 150 - 400 K/uL  ABO/Rh     Status: None (Preliminary result)   Collection Time: 02/19/17  7:54 PM  Result Value Ref Range   ABO/RH(D) A POS     --/--/A POS (09/16 1954)  IMAGING No results found.  Preliminary report of OB Limited US with subjectively normal cervical length, no abnormalities of placenta  MAU Management/MDM: Ordered labs and reviewed results.  Minimal bleeding on speculum exam, c/w postcoital bleeding.  Cervix subjectively short on SVE so sent for Limited OB US for cervical length which was normal. Consult Dr Elon Spanner with assessment and findings.  D/C home with bleeding precautions. F/U in office for recheck of cervical length. Return to MAU as needed for emergencies.   ASSESSMENT 1. Spotting affecting pregnancy in second trimester   2. Vaginal bleeding in pregnancy, second trimester     PLAN Discharge home Allergies as of 02/19/2017   No Known Allergies     Medication List    STOP taking these medications   oxyCODONE-acetaminophen 5-325 MG tablet Commonly known as:  PERCOCET     TAKE these medications   dicyclomine 20 MG tablet Commonly known as:  BENTYL Take one every 6 hours as needed for abdominal cramps   ondansetron 4 MG disintegrating tablet Commonly known as:  ZOFRAN ODT  ODT q4 hours prn nausea/vomit   pantoprazole 20 MG tablet Commonly known as:  PROTONIX Take 1 tablet (20 mg total) by mouth daily.            Discharge Care Instructions        Start     Ordered    02/19/17 0000  Discharge patient    Question Answer Comment  Discharge disposition 01-Home or Self Care   Discharge patient date 02/19/2017      02/19/17 2043     Follow-up Information    Ranae Pila, MD Follow up.   Specialty:  Obstetrics and Gynecology Why:  As scheduled on 9/24 or sooner if symptoms persist.  Return to MAU as needed for emergencies. Contact information: [address] [place] [address] [phone]  Sharen Counter Certified Nurse-Midwife 02/19/2017  8:44 PM

## 2017-02-19 NOTE — MAU Note (Addendum)
Vaginal bleeding when wipes Red brown in color Started after intercourse today  Lower abdominal cramping Rating pain 4/10  EDD 08/16/17 per patient

## 2017-02-20 LAB — ABO/RH: ABO/RH(D): A POS

## 2017-02-20 LAB — GC/CHLAMYDIA PROBE AMP (~~LOC~~) NOT AT ARMC
CHLAMYDIA, DNA PROBE: NEGATIVE
NEISSERIA GONORRHEA: NEGATIVE

## 2017-04-24 ENCOUNTER — Encounter (HOSPITAL_COMMUNITY): Payer: Self-pay

## 2017-04-24 ENCOUNTER — Inpatient Hospital Stay (HOSPITAL_COMMUNITY)
Admission: AD | Admit: 2017-04-24 | Discharge: 2017-04-24 | Disposition: A | Discharge status: Home / Self Care | Payer: BLUE CROSS/BLUE SHIELD | Source: Ambulatory Visit | Attending: Obstetrics and Gynecology | Admitting: Obstetrics and Gynecology

## 2017-04-24 DIAGNOSIS — O368120 Decreased fetal movements, second trimester, not applicable or unspecified: Secondary | ICD-10-CM

## 2017-04-24 DIAGNOSIS — O26899 Other specified pregnancy related conditions, unspecified trimester: Secondary | ICD-10-CM

## 2017-04-24 DIAGNOSIS — O368121 Decreased fetal movements, second trimester, fetus 1: Secondary | ICD-10-CM

## 2017-04-24 DIAGNOSIS — Z3A23 23 weeks gestation of pregnancy: Secondary | ICD-10-CM | POA: Diagnosis not present

## 2017-04-24 DIAGNOSIS — O3092 Multiple gestation, unspecified, second trimester: Secondary | ICD-10-CM | POA: Diagnosis not present

## 2017-04-24 DIAGNOSIS — R102 Pelvic and perineal pain: Secondary | ICD-10-CM

## 2017-04-24 DIAGNOSIS — O26892 Other specified pregnancy related conditions, second trimester: Secondary | ICD-10-CM

## 2017-04-24 DIAGNOSIS — Z79899 Other long term (current) drug therapy: Secondary | ICD-10-CM | POA: Diagnosis not present

## 2017-04-24 LAB — URINALYSIS, ROUTINE W REFLEX MICROSCOPIC
Bilirubin Urine: NEGATIVE
Glucose, UA: NEGATIVE mg/dL
Hgb urine dipstick: NEGATIVE
Ketones, ur: NEGATIVE mg/dL
Nitrite: NEGATIVE
Protein, ur: NEGATIVE mg/dL
Specific Gravity, Urine: 1.026 (ref 1.005–1.030)
pH: 5 (ref 5.0–8.0)

## 2017-04-24 MED ORDER — COMFORT FIT MATERNITY SUPP LG MISC: 1.0000 | Freq: Every day | 0 refills | Status: DC

## 2017-04-24 NOTE — MAU Note (Signed)
Sharp pains in pressure across abdomen and in pelvis. No vaginal bleeding, no LOF. Also reports not having felt baby move at all since Saturday.

## 2017-04-24 NOTE — MAU Provider Note (Signed)
Chief Complaint:  Abdominal Pain and Decreased Fetal Movement   First Provider Initiated Contact with Patient 04/24/17 [phone]      HPI: Desiree Singh is a 36 y.o. G3P1010 at 5023w5dwho presents to maternity admissions reporting decreased fetal movement and abdominal pain. Reports she has not felt baby move since Saturday 11/17 afternoon but currently feels movement since external monitors were placed. Abdominal pain is described as a sharp stabbing pain in her lower groin when she stands up and moves. Pain started occurring two days ago as well with little relief from Tylenol 500mg  that she has been taking. She denies contractions, LOF, vaginal bleeding, vaginal itching/burning, urinary symptoms, h/a, dizziness, n/v, or fever/chills.     Past Medical History: Past Medical History:  Diagnosis Date  . Hypertension     Past obstetric history: OB History  Gravida Para Term Preterm AB Living  3 1 1   1     SAB TAB Ectopic Multiple Live Births  1            # Outcome Date GA Lbr Len/2nd Weight Sex Delivery Anes PTL Lv  3 Current           2 Term      Vag-Spont     1 SAB               Past Surgical History: Past Surgical History:  Procedure Laterality Date  . TYMPANOSTOMY TUBE PLACEMENT      Family History: Family History  Problem Relation Age of Onset  . Hypertension Mother     Social History: Social History   Tobacco Use  . Smoking status: Never Smoker  . Smokeless tobacco: Never Used  Substance Use Topics  . Alcohol use: No  . Drug use: No    Allergies: No Known Allergies  Meds:  Medications Prior to Admission  Medication Sig Dispense Refill Last Dose  . IRON PO Take by mouth.     Marland Kitchen. BONJESTA 20-20 MG TBCR TAKE 1 TABLET BY MOUTH AT BEDTIME CAN INCREASE TO 1 TAB AM 1 TAB AT BED IF SYMPTOMS PERSIST ON DAY 2  1   . dicyclomine (BENTYL) 20 MG tablet Take one every 6 hours as needed for abdominal cramps 20 tablet 0   . ondansetron (ZOFRAN ODT) 4 MG disintegrating tablet  4mg  ODT q4 hours prn nausea/vomit 12 tablet 0   . pantoprazole (PROTONIX) 20 MG tablet Take 1 tablet (20 mg total) by mouth daily. 20 tablet 0   . VENTOLIN HFA 108 (90 Base) MCG/ACT inhaler USE 2 PUFFS AS NEEDED EVERY 6 HRS INHALATION 30 DAYS  1     ROS:  Review of Systems  Constitutional: Negative for chills and fever.  Respiratory: Negative for cough, chest tightness, shortness of breath and wheezing.   Cardiovascular: Negative for chest pain and leg swelling.  Gastrointestinal: Positive for abdominal pain. Negative for constipation, diarrhea, nausea and vomiting.  Genitourinary: Negative for difficulty urinating, dysuria, flank pain, urgency, vaginal bleeding and vaginal discharge.  Musculoskeletal: Negative for back pain.  Neurological: Negative for headaches.  Psychiatric/Behavioral: Negative for agitation, behavioral problems and confusion.   I have reviewed patient's Past Medical Hx, Surgical Hx, Family Hx, Social Hx, medications and allergies.   Physical Exam   Patient Vitals for the past 24 hrs:  BP Temp Temp src Pulse Resp  04/24/17 0914 130/74 98.3 F (36.8 C) Oral 85 18   Constitutional: Well-developed, well-nourished female in no acute distress.  Cardiovascular: normal rate Respiratory:  normal effort GI: Abd soft, non-tender, gravid appropriate for gestational age.  MS: Extremities nontender, no edema, normal ROM Neurologic: Alert and oriented x 4.  GU: Neg CVAT.  Cervical exam: Dilation: Closed Effacement (%): Thick Cervical Position: Posterior Exam by:: v Galan Ghee cnm  FHT:  Baseline 135 , moderate variability, accelerations present, no decelerations Contractions: none   Labs: Results for orders placed or performed during the hospital encounter of 04/24/17 (from the past 24 hour(s))  Urinalysis, Routine w reflex microscopic     Status: Abnormal   Collection Time: 04/24/17  9:00 AM  Result Value Ref Range   Color, Urine YELLOW YELLOW   APPearance HAZY (A)  CLEAR   Specific Gravity, Urine 1.026 1.005 - 1.030   pH 5.0 5.0 - 8.0   Glucose, UA NEGATIVE NEGATIVE mg/dL   Hgb urine dipstick NEGATIVE NEGATIVE   Bilirubin Urine NEGATIVE NEGATIVE   Ketones, ur NEGATIVE NEGATIVE mg/dL   Protein, ur NEGATIVE NEGATIVE mg/dL   Nitrite NEGATIVE NEGATIVE   Leukocytes, UA TRACE (A) NEGATIVE   RBC / HPF 0-5 0 - 5 RBC/hpf   WBC, UA 0-5 0 - 5 WBC/hpf   Bacteria, UA RARE (A) NONE SEEN   Squamous Epithelial / LPF 6-30 (A) NONE SEEN   Mucus PRESENT    --/--/A POS (09/16 1954)  MAU Course/MDM: NST reviewed Urinalysis  OB Urine Culture  Consult Dr Elon SpannerLeger with findings and plan.   Today's evaluation included a work-up for preterm labor which can be life-threatening for both mom and baby.Pt discharge with PTL precautions.  Assessment: 1. Pain of round ligament affecting pregnancy, antepartum   2. Decreased fetal movements in second trimester, fetus 1 of multiple gestation    Plan: Discharge home PTL precautions and fetal kick counts Rx for Maternity support belt  Follow up in the office as scheduled and return to MAU as needed for emergencies    Steward DroneVeronica Hassen Bruun Certified Nurse-Midwife 04/24/2017 9:48 AM

## 2017-04-24 NOTE — MAU Note (Signed)
Urine in lab 

## 2017-04-25 LAB — CULTURE, OB URINE: Culture: NO GROWTH

## 2017-07-07 ENCOUNTER — Encounter (HOSPITAL_COMMUNITY): Payer: Self-pay | Admitting: *Deleted

## 2017-07-07 ENCOUNTER — Inpatient Hospital Stay (HOSPITAL_COMMUNITY)
Admission: AD | Admit: 2017-07-07 | Discharge: 2017-07-07 | Disposition: A | Discharge status: Home / Self Care | Payer: BLUE CROSS/BLUE SHIELD | Source: Ambulatory Visit | Attending: Obstetrics and Gynecology | Admitting: Obstetrics and Gynecology

## 2017-07-07 ENCOUNTER — Other Ambulatory Visit: Payer: Self-pay

## 2017-07-07 DIAGNOSIS — O163 Unspecified maternal hypertension, third trimester: Secondary | ICD-10-CM | POA: Diagnosis not present

## 2017-07-07 DIAGNOSIS — Z3A34 34 weeks gestation of pregnancy: Secondary | ICD-10-CM | POA: Insufficient documentation

## 2017-07-07 DIAGNOSIS — R51 Headache: Secondary | ICD-10-CM | POA: Diagnosis present

## 2017-07-07 DIAGNOSIS — R519 Headache, unspecified: Secondary | ICD-10-CM

## 2017-07-07 DIAGNOSIS — O26893 Other specified pregnancy related conditions, third trimester: Secondary | ICD-10-CM | POA: Diagnosis present

## 2017-07-07 DIAGNOSIS — O169 Unspecified maternal hypertension, unspecified trimester: Secondary | ICD-10-CM | POA: Diagnosis not present

## 2017-07-07 LAB — CBC
HEMATOCRIT: 34.5 % — AB (ref 36.0–46.0)
Hemoglobin: 11.7 g/dL — ABNORMAL LOW (ref 12.0–15.0)
MCH: 29.7 pg (ref 26.0–34.0)
MCHC: 33.9 g/dL (ref 30.0–36.0)
MCV: 87.6 fL (ref 78.0–100.0)
PLATELETS: 153 10*3/uL (ref 150–400)
RBC: 3.94 MIL/uL (ref 3.87–5.11)
RDW: 13.7 % (ref 11.5–15.5)
WBC: 8.9 10*3/uL (ref 4.0–10.5)

## 2017-07-07 LAB — URINALYSIS, ROUTINE W REFLEX MICROSCOPIC
BILIRUBIN URINE: NEGATIVE
Glucose, UA: NEGATIVE mg/dL
HGB URINE DIPSTICK: NEGATIVE
Ketones, ur: NEGATIVE mg/dL
NITRITE: NEGATIVE
PROTEIN: NEGATIVE mg/dL
SPECIFIC GRAVITY, URINE: 1.025 (ref 1.005–1.030)
pH: 5 (ref 5.0–8.0)

## 2017-07-07 LAB — COMPREHENSIVE METABOLIC PANEL
ALBUMIN: 2.8 g/dL — AB (ref 3.5–5.0)
ALT: 16 U/L (ref 14–54)
AST: 18 U/L (ref 15–41)
Alkaline Phosphatase: 81 U/L (ref 38–126)
Anion gap: 8 (ref 5–15)
BUN: 10 mg/dL (ref 6–20)
CHLORIDE: 105 mmol/L (ref 101–111)
CO2: 22 mmol/L (ref 22–32)
CREATININE: 0.67 mg/dL (ref 0.44–1.00)
Calcium: 8.8 mg/dL — ABNORMAL LOW (ref 8.9–10.3)
GFR calc Af Amer: >60 mL/min (ref >60)
GLUCOSE: 121 mg/dL — AB (ref 65–99)
POTASSIUM: 4 mmol/L (ref 3.5–5.1)
Sodium: 135 mmol/L (ref 135–145)
Total Bilirubin: 0.3 mg/dL (ref 0.3–1.2)
Total Protein: 6.6 g/dL (ref 6.5–8.1)

## 2017-07-07 LAB — PROTEIN / CREATININE RATIO, URINE
Creatinine, Urine: 216 mg/dL
Protein Creatinine Ratio: 0.04 mg/mg{Cre} (ref 0.00–0.15)
Total Protein, Urine: 8 mg/dL

## 2017-07-07 MED ORDER — METOCLOPRAMIDE HCL 5 MG/ML IJ SOLN
10.0000 mg | Freq: Once | INTRAMUSCULAR | Status: AC
Start: 2017-07-07 — End: 2017-07-07
  Administered 2017-07-07: 10 mg via INTRAVENOUS
  Filled 2017-07-07: qty 2

## 2017-07-07 MED ORDER — DEXAMETHASONE SODIUM PHOSPHATE 10 MG/ML IJ SOLN
10.0000 mg | Freq: Once | INTRAMUSCULAR | Status: AC
Start: 2017-07-07 — End: 2017-07-07
  Administered 2017-07-07: 10 mg via INTRAVENOUS
  Filled 2017-07-07: qty 1

## 2017-07-07 MED ORDER — DIPHENHYDRAMINE HCL 50 MG/ML IJ SOLN
25.0000 mg | Freq: Once | INTRAMUSCULAR | Status: AC
  Administered 2017-07-07: 25 mg via INTRAVENOUS
  Filled 2017-07-07: qty 1

## 2017-07-07 MED ORDER — SODIUM CHLORIDE 0.9 % IV SOLN
INTRAVENOUS | Status: DC
  Administered 2017-07-07: 17:00:00 via INTRAVENOUS

## 2017-07-07 NOTE — MAU Note (Signed)
For about 2 days, has had extreme headaches, very sensitive to light.  Had a nosebleed yesterday. Random sweats, profusely. Extreme swelling of ankles, calves and finger, not as bad today- has been laying down today.  Went and got BP checked 137/91, was told to come get checked.  Hx of high BP, has been off meds for a year. Seeing spots, denies epigastric pain. Took Tylenol at noon

## 2017-07-07 NOTE — MAU Provider Note (Signed)
History     CSN: [id number]  Arrival date and time: 07/07/17 1527   First Provider Initiated Contact with Patient 07/07/17 1613      Chief Complaint  Patient presents with  . Headache  . Hypertension   HPI  Ms.  Desiree Farrieriffany Singh is a 37 y.o. year old 303P1010 female at 55110w2d weeks gestation who presents to MAU reporting "extreme" H/A x 2 days, light sensitivity, nosebleed (yesterday), random "profuse" sweats, increased BLE swelling, and seeing spots. She had BP checked today (not in office); 137/91. She called the office to report this BP and was [facility]. She has a h/o high BPs, has been on meds off & on for a year. She denies epigastric pain. She last took Tylenol around noon.  Past Medical History:  Diagnosis Date  . Hypertension     Past Surgical History:  Procedure Laterality Date  . TYMPANOSTOMY TUBE PLACEMENT      Family History  Problem Relation Age of Onset  . Hypertension Mother     Social History   Tobacco Use  . Smoking status: Never Smoker  . Smokeless tobacco: Never Used  Substance Use Topics  . Alcohol use: No  . Drug use: No    Allergies: No Known Allergies  Medications Prior to Admission  Medication Sig Dispense Refill Last Dose  . acetaminophen (TYLENOL) 500 MG tablet Take 1,000 mg every 6 (six) hours as needed by mouth for mild pain or headache.   04/24/2017 at Unknown time  . BONJESTA 20-20 MG TBCR TAKE 1 TABLET BY MOUTH AT BEDTIME CAN INCREASE TO 1 TAB AM 1 TAB AT BED IF SYMPTOMS PERSIST ON DAY 2  1 04/24/2017 at Unknown time  . Elastic Bandages & Supports (COMFORT FIT MATERNITY SUPP LG) MISC 1 Device daily by Does not apply route. 1 each 0   . IRON PO Take by mouth.     . ondansetron (ZOFRAN ODT) 4 MG disintegrating tablet 4mg  ODT q4 hours prn nausea/vomit 12 tablet 0   . Prenatal Vit-Fe Fumarate-FA (PRENATAL MULTIVITAMIN) TABS tablet Take 1 tablet daily at 12 noon by mouth.   04/24/2017 at Unknown time  . VENTOLIN HFA  108 (90 Base) MCG/ACT inhaler USE 2 PUFFS AS NEEDED EVERY 6 HRS INHALATION 30 DAYS  1 prn    Review of Systems Physical Exam   Blood pressure 104/72, pulse 66, temperature 99 F (37.2 C), temperature source Oral, resp. rate 18, weight 273 lb 4 oz (123.9 kg), last menstrual period 11/08/2016, SpO2 100 %.  Physical Exam  MAU Course  Procedures  MDM CBC CMP CCUA P/C Ratio Headache Protocol: LR 1000 ml @ bolus rate with Benadryl 25 mg, Reglan 10 mg, Decadron 10 mg all by slow IVP NST - FHR: 130 bpm / moderate variability / accels present / decels absent / TOCO: none  *Consult with Dr. Marcelle OverlieHolland @ 1815 - notified of patient's complaints, assessments, lab & U/S results, recommended tx plan d/c home, have her take Tylenol 1000 mg every 8 hrs prn pain - ok to d/c home, agrees with plan  Results for orders placed or performed during the hospital encounter of 07/07/17 (from the past 24 hour(s))  Urinalysis, Routine w reflex microscopic     Status: Abnormal   Collection Time: 07/07/17  3:49 PM  Result Value Ref Range   Color, Urine YELLOW YELLOW   APPearance HAZY (A) CLEAR   Specific Gravity, Urine 1.025 1.005 - 1.030  pH 5.0 5.0 - 8.0   Glucose, UA NEGATIVE NEGATIVE mg/dL   Hgb urine dipstick NEGATIVE NEGATIVE   Bilirubin Urine NEGATIVE NEGATIVE   Ketones, ur NEGATIVE NEGATIVE mg/dL   Protein, ur NEGATIVE NEGATIVE mg/dL   Nitrite NEGATIVE NEGATIVE   Leukocytes, UA MODERATE (A) NEGATIVE   RBC / HPF 0-5 0 - 5 RBC/hpf   WBC, UA 6-30 0 - 5 WBC/hpf   Bacteria, UA RARE (A) NONE SEEN   Squamous Epithelial / LPF 6-30 (A) NONE SEEN   Mucus PRESENT   Protein / creatinine ratio, urine     Status: None   Collection Time: 07/07/17  3:49 PM  Result Value Ref Range   Creatinine, Urine 216.00 mg/dL   Total Protein, Urine 8 mg/dL   Protein Creatinine Ratio 0.04 0.00 - 0.15 mg/mg[Cre]  CBC     Status: Abnormal   Collection Time: 07/07/17  4:36 PM  Result Value Ref Range   WBC 8.9 4.0 - 10.5  K/uL   RBC 3.94 3.87 - 5.11 MIL/uL   Hemoglobin 11.7 (L) 12.0 - 15.0 g/dL   HCT 40.9 (L) 81.1 - 91.4 %   MCV 87.6 78.0 - 100.0 fL   MCH 29.7 26.0 - 34.0 pg   MCHC 33.9 30.0 - 36.0 g/dL   RDW 78.2 95.6 - 21.3 %   Platelets 153 150 - 400 K/uL  Comprehensive metabolic panel     Status: Abnormal   Collection Time: 07/07/17  4:36 PM  Result Value Ref Range   Sodium 135 135 - 145 mmol/L   Potassium 4.0 3.5 - 5.1 mmol/L   Chloride 105 101 - 111 mmol/L   CO2 22 22 - 32 mmol/L   Glucose, Bld 121 (H) 65 - 99 mg/dL   BUN 10 6 - 20 mg/dL   Creatinine, Ser 0.86 0.44 - 1.00 mg/dL   Calcium 8.8 (L) 8.9 - 10.3 mg/dL   Total Protein 6.6 6.5 - 8.1 g/dL   Albumin 2.8 (L) 3.5 - 5.0 g/dL   AST 18 15 - 41 U/L   ALT 16 14 - 54 U/L   Alkaline Phosphatase 81 38 - 126 U/L   Total Bilirubin 0.3 0.3 - 1.2 mg/dL   GFR calc non Af Amer >60 >60 mL/min   GFR calc Af Amer >60 >60 mL/min   Anion gap 8 5 - 15     Assessment and Plan  Headache in pregnancy, antepartum, third trimester - Continue Tylenol 1000 mg po every 8 hrs prn pain  Hypertension affecting pregnancy in third trimester - Information provided on PEC - F/U with [facility], if sx's persists or worsen  Discharge home  Patient verbalized an understanding of the plan of care and agrees.   Raelyn Mora, MSN, CNM 07/07/2017, 4:24 PM

## 2017-07-07 NOTE — Discharge Instructions (Signed)
Continue Tylenol 1000 mg every 8 hrs as needed for pain.

## 2017-08-02 ENCOUNTER — Encounter (HOSPITAL_COMMUNITY): Payer: Self-pay | Admitting: *Deleted

## 2017-08-02 ENCOUNTER — Inpatient Hospital Stay (HOSPITAL_COMMUNITY)
Admission: AD | Admit: 2017-08-02 | Discharge: 2017-08-03 | Disposition: A | Discharge status: Home / Self Care | Payer: BLUE CROSS/BLUE SHIELD | Source: Ambulatory Visit | Attending: Obstetrics and Gynecology | Admitting: Obstetrics and Gynecology

## 2017-08-02 DIAGNOSIS — Z79899 Other long term (current) drug therapy: Secondary | ICD-10-CM | POA: Insufficient documentation

## 2017-08-02 DIAGNOSIS — O163 Unspecified maternal hypertension, third trimester: Secondary | ICD-10-CM | POA: Insufficient documentation

## 2017-08-02 DIAGNOSIS — R42 Dizziness and giddiness: Secondary | ICD-10-CM | POA: Insufficient documentation

## 2017-08-02 DIAGNOSIS — O26893 Other specified pregnancy related conditions, third trimester: Secondary | ICD-10-CM | POA: Insufficient documentation

## 2017-08-02 DIAGNOSIS — O1203 Gestational edema, third trimester: Secondary | ICD-10-CM | POA: Diagnosis not present

## 2017-08-02 DIAGNOSIS — R1010 Upper abdominal pain, unspecified: Secondary | ICD-10-CM | POA: Diagnosis not present

## 2017-08-02 DIAGNOSIS — R51 Headache: Secondary | ICD-10-CM | POA: Insufficient documentation

## 2017-08-02 DIAGNOSIS — Z3A38 38 weeks gestation of pregnancy: Secondary | ICD-10-CM | POA: Insufficient documentation

## 2017-08-02 DIAGNOSIS — R0602 Shortness of breath: Secondary | ICD-10-CM | POA: Diagnosis not present

## 2017-08-02 LAB — URINALYSIS, ROUTINE W REFLEX MICROSCOPIC
Bilirubin Urine: NEGATIVE
Glucose, UA: 50 mg/dL — AB
Hgb urine dipstick: NEGATIVE
Ketones, ur: 5 mg/dL — AB
Nitrite: NEGATIVE
Protein, ur: 30 mg/dL — AB
Specific Gravity, Urine: 1.029 (ref 1.005–1.030)
pH: 5 (ref 5.0–8.0)

## 2017-08-02 LAB — PROTEIN / CREATININE RATIO, URINE
Creatinine, Urine: 247 mg/dL
Protein Creatinine Ratio: 0.04 mg/mg{Cre} (ref 0.00–0.15)
Total Protein, Urine: 10 mg/dL

## 2017-08-02 LAB — COMPREHENSIVE METABOLIC PANEL
ALT: 14 U/L (ref 14–54)
AST: 19 U/L (ref 15–41)
Albumin: 2.8 g/dL — ABNORMAL LOW (ref 3.5–5.0)
Alkaline Phosphatase: 96 U/L (ref 38–126)
Anion gap: 6 (ref 5–15)
BUN: 12 mg/dL (ref 6–20)
CO2: 22 mmol/L (ref 22–32)
Calcium: 8.8 mg/dL — ABNORMAL LOW (ref 8.9–10.3)
Chloride: 105 mmol/L (ref 101–111)
Creatinine, Ser: 0.69 mg/dL (ref 0.44–1.00)
GFR calc Af Amer: >60 mL/min (ref >60)
GFR calc non Af Amer: >60 mL/min (ref >60)
Glucose, Bld: 94 mg/dL (ref 65–99)
Potassium: 3.8 mmol/L (ref 3.5–5.1)
Sodium: 133 mmol/L — ABNORMAL LOW (ref 135–145)
Total Bilirubin: 0.6 mg/dL (ref 0.3–1.2)
Total Protein: 6.3 g/dL — ABNORMAL LOW (ref 6.5–8.1)

## 2017-08-02 LAB — CBC
HCT: 32.5 % — ABNORMAL LOW (ref 36.0–46.0)
Hemoglobin: 10.9 g/dL — ABNORMAL LOW (ref 12.0–15.0)
MCH: 28.6 pg (ref 26.0–34.0)
MCHC: 33.5 g/dL (ref 30.0–36.0)
MCV: 85.3 fL (ref 78.0–100.0)
Platelets: 159 10*3/uL (ref 150–400)
RBC: 3.81 MIL/uL — ABNORMAL LOW (ref 3.87–5.11)
RDW: 13.7 % (ref 11.5–15.5)
WBC: 9.9 10*3/uL (ref 4.0–10.5)

## 2017-08-02 MED ORDER — DEXAMETHASONE SODIUM PHOSPHATE 10 MG/ML IJ SOLN
10.0000 mg | Freq: Once | INTRAMUSCULAR | Status: AC
  Administered 2017-08-02: 10 mg via INTRAVENOUS
  Filled 2017-08-02: qty 1

## 2017-08-02 MED ORDER — METOCLOPRAMIDE HCL 5 MG/ML IJ SOLN
10.0000 mg | Freq: Once | INTRAMUSCULAR | Status: AC
  Administered 2017-08-02: 10 mg via INTRAVENOUS
  Filled 2017-08-02: qty 2

## 2017-08-02 MED ORDER — ACETAMINOPHEN 500 MG PO TABS
1000.0000 mg | ORAL_TABLET | Freq: Once | ORAL | Status: AC
  Administered 2017-08-02: 1000 mg via ORAL
  Filled 2017-08-02: qty 2

## 2017-08-02 MED ORDER — LACTATED RINGERS IV BOLUS (SEPSIS)
1000.0000 mL | Freq: Once | INTRAVENOUS | Status: AC
  Administered 2017-08-02: 1000 mL via INTRAVENOUS

## 2017-08-02 MED ORDER — DIPHENHYDRAMINE HCL 50 MG/ML IJ SOLN
25.0000 mg | Freq: Once | INTRAMUSCULAR | Status: AC
  Administered 2017-08-02: 25 mg via INTRAVENOUS
  Filled 2017-08-02: qty 1

## 2017-08-02 NOTE — MAU Note (Signed)
Pt presents to MAU C/o high blood pressure, sob (that has resolved) HA and lightheadedness. Pt reports the HA 7/10. Pt denies bleeding or LOF reports + FM. Occasional braxton hicks.

## 2017-08-02 NOTE — MAU Provider Note (Signed)
Chief Complaint:  Hypertension; Dizziness; and Shortness of Breath   First Provider Initiated Contact with Patient 08/02/17 2139     HPI: Desiree Singh is a 37 y.o. G3P1010 at 86w0dwho presents to maternity admissions reporting Hypertension, dizziness, burred vision, upper abdominal pain and SOB. She reports that the SOB is resolved and she is no longer having an occurrence of it, reports that she was SOB when she was moving around her house. She reports she has elevated BP and is told to take BP at home, last BP at home is reported to be 167/93 around 2000. Associated symptoms with BP is headache rated 7/10- has not taken medication for HA, blurred vision and dizziness, and increased swelling of hands and feet. Pt reports not being on any medication for BP at this time. She reports she is scheduled for a C/S due to breech presentation. She reports good fetal movement, denies LOF, vaginal bleeding, vaginal itching/burning, urinary symptoms or fever/chills.    Past Medical History: Past Medical History:  Diagnosis Date  . Hypertension     Past obstetric history: OB History  Gravida Para Term Preterm AB Living  3 1 1   1     SAB TAB Ectopic Multiple Live Births  1            # Outcome Date GA Lbr Len/2nd Weight Sex Delivery Anes PTL Lv  3 Current           2 Term      Vag-Spont     1 SAB               Past Surgical History: Past Surgical History:  Procedure Laterality Date  . TYMPANOSTOMY TUBE PLACEMENT      Family History: Family History  Problem Relation Age of Onset  . Hypertension Mother     Social History: Social History   Tobacco Use  . Smoking status: Never Smoker  . Smokeless tobacco: Never Used  Substance Use Topics  . Alcohol use: No  . Drug use: No    Allergies: No Known Allergies  Meds:  Medications Prior to Admission  Medication Sig Dispense Refill Last Dose  . acetaminophen (TYLENOL) 500 MG tablet Take 1,000 mg every 6 (six) hours as needed by mouth  for mild pain or headache.   08/01/2017 at Unknown time  . BONJESTA 20-20 MG TBCR TAKE 1 TABLET BY MOUTH AT BEDTIME CAN INCREASE TO 1 TAB AM 1 TAB AT BED IF SYMPTOMS PERSIST ON DAY 2  1 Past Week at Unknown time  . cyclobenzaprine (FLEXERIL) 10 MG tablet Take 10 mg by mouth 2 (two) times daily as needed for muscle spasms (2tab at a time).   Past Week at Unknown time  . Elastic Bandages & Supports (COMFORT FIT MATERNITY SUPP LG) MISC 1 Device daily by Does not apply route. 1 each 0 More than a month at Unknown time  . IRON PO Take by mouth.   More than a month at Unknown time  . ondansetron (ZOFRAN ODT) 4 MG disintegrating tablet 4mg  ODT q4 hours prn nausea/vomit 12 tablet 0 Unknown at Unknown time  . Prenatal Vit-Fe Fumarate-FA (PRENATAL MULTIVITAMIN) TABS tablet Take 1 tablet daily at 12 noon by mouth.   More than a month at Unknown time  . VENTOLIN HFA 108 (90 Base) MCG/ACT inhaler USE 2 PUFFS AS NEEDED EVERY 6 HRS INHALATION 30 DAYS  1 More than a month at Unknown time    ROS:  Review of  Systems  Constitutional: Negative for chills and fever.       Hypertension  Eyes: Positive for visual disturbance.  Respiratory: Positive for shortness of breath. Negative for apnea, chest tightness and wheezing.   Cardiovascular: Negative.   Gastrointestinal: Negative.   Genitourinary: Negative.   Musculoskeletal: Negative.   Neurological: Positive for dizziness and headaches. Negative for weakness and light-headedness.  Psychiatric/Behavioral: Negative.    I have reviewed patient's Past Medical Hx, Surgical Hx, Family Hx, Social Hx, medications and allergies.   Physical Exam   Patient Vitals for the past 24 hrs:  BP Temp Temp src Pulse Resp SpO2 Weight  08/02/17 2345 - 98.1 F (36.7 C) Oral - - - -  08/02/17 2316 138/86 - - 61 - - -  08/02/17 2301 127/84 - - 83 - 100 % -  08/02/17 2248 137/81 - - 67 - - -  08/02/17 2231 130/87 - - 84 - - -  08/02/17 2216 131/80 - - 78 - - -  08/02/17 2201  140/73 - - 76 - - -  08/02/17 2146 (!) 146/92 - - 84 - - -  08/02/17 2131 (!) 142/87 - - 86 - - -  08/02/17 2117 (!) 145/93 98.2 F (36.8 C) Oral 87 19 - -  08/02/17 2100 - - - - - - 284 lb 14.4 oz (129.2 kg)   Constitutional: Well-developed, morbid obese female in no acute distress.  Cardiovascular: normal rate Respiratory: normal effort, no wheezing or rales present, lung sound clear bilateral, not diminished GI: Abd soft, tender in epigastric region and upper right quadrant, gravid appropriate for gestational age.  MS: Extremities nontender, +2 pedal edema, normal ROM, +1 DTR and no clonus present.  Neurologic: Alert and oriented x 4.  GU: Neg CVAT. Cervical exam:  Dilation: 1.5 Effacement (%): Thick Station: Ballotable Exam by:: V.Courney Garrod CNM   FHT:  Baseline 135 , moderate variability, accelerations present, no decelerations Contractions: q 3-6 mins, mild    Labs: Results for orders placed or performed during the hospital encounter of 08/02/17 (from the past 24 hour(s))  Protein / creatinine ratio, urine     Status: None   Collection Time: 08/02/17  9:02 PM  Result Value Ref Range   Creatinine, Urine 247.00 mg/dL   Total Protein, Urine 10 mg/dL   Protein Creatinine Ratio 0.04 0.00 - 0.15 mg/mg[Cre]  Urinalysis, Routine w reflex microscopic     Status: Abnormal   Collection Time: 08/02/17  9:02 PM  Result Value Ref Range   Color, Urine YELLOW YELLOW   APPearance HAZY (A) CLEAR   Specific Gravity, Urine 1.029 1.005 - 1.030   pH 5.0 5.0 - 8.0   Glucose, UA 50 (A) NEGATIVE mg/dL   Hgb urine dipstick NEGATIVE NEGATIVE   Bilirubin Urine NEGATIVE NEGATIVE   Ketones, ur 5 (A) NEGATIVE mg/dL   Protein, ur 30 (A) NEGATIVE mg/dL   Nitrite NEGATIVE NEGATIVE   Leukocytes, UA TRACE (A) NEGATIVE   RBC / HPF 0-5 0 - 5 RBC/hpf   WBC, UA 6-30 0 - 5 WBC/hpf   Bacteria, UA FEW (A) NONE SEEN   Squamous Epithelial / LPF 6-30 (A) NONE SEEN   Mucus PRESENT    Ca Oxalate Crys, UA  PRESENT   CBC     Status: Abnormal   Collection Time: 08/02/17  9:14 PM  Result Value Ref Range   WBC 9.9 4.0 - 10.5 K/uL   RBC 3.81 (L) 3.87 - 5.11 MIL/uL   Hemoglobin  10.9 (L) 12.0 - 15.0 g/dL   HCT 16.1 (L) 09.6 - 04.5 %   MCV 85.3 78.0 - 100.0 fL   MCH 28.6 26.0 - 34.0 pg   MCHC 33.5 30.0 - 36.0 g/dL   RDW 40.9 81.1 - 91.4 %   Platelets 159 150 - 400 K/uL  Comprehensive metabolic panel     Status: Abnormal   Collection Time: 08/02/17  9:14 PM  Result Value Ref Range   Sodium 133 (L) 135 - 145 mmol/L   Potassium 3.8 3.5 - 5.1 mmol/L   Chloride 105 101 - 111 mmol/L   CO2 22 22 - 32 mmol/L   Glucose, Bld 94 65 - 99 mg/dL   BUN 12 6 - 20 mg/dL   Creatinine, Ser 7.82 0.44 - 1.00 mg/dL   Calcium 8.8 (L) 8.9 - 10.3 mg/dL   Total Protein 6.3 (L) 6.5 - 8.1 g/dL   Albumin 2.8 (L) 3.5 - 5.0 g/dL   AST 19 15 - 41 U/L   ALT 14 14 - 54 U/L   Alkaline Phosphatase 96 38 - 126 U/L   Total Bilirubin 0.6 0.3 - 1.2 mg/dL   GFR calc non Af Amer >60 >60 mL/min   GFR calc Af Amer >60 >60 mL/min   Anion gap 6 5 - 15   --/--/A POS (09/16 1954)  Imaging:  No results found.  MAU Course/MDM: Orders Placed This Encounter  Procedures  . CBC  . Comprehensive metabolic panel  . Protein / creatinine ratio, urine  . Urinalysis, Routine w reflex microscopic  Pre E labs- normal   Meds ordered this encounter  Medications  . acetaminophen (TYLENOL) tablet 1,000 mg  . AND Linked Order Group   . diphenhydrAMINE (BENADRYL) injection 25 mg   . metoCLOPramide (REGLAN) injection 10 mg   . dexamethasone (DECADRON) injection 10 mg  . lactated ringers bolus 1,000 mL   NST reviewed-reactive Treatments in MAU included Tylenol 1,000mg  PO- patient reports mild relief with medication and HA is down to 6/10. IV fluid bolus started and HA cocktail given of decadron, reglan and benadryl. Pt reports moderate relief with HA cocktail, rates pain 2/10.  Pt reports dizziness is relieved with IV fluid and she  reports no visual changes.    Consult Dr Rana Snare with presentation, exam findings and test results. Okay to discharge home with follow up in the office as scheduled.   Pt discharge with strict preeclampsia precautions.  Today's evaluation included a work-up for preterm labor which can be life-threatening for both mom and baby.  Assessment: 1. Hypertension affecting pregnancy in third trimester   2. Headache in pregnancy, antepartum, third trimester   3. Edema during pregnancy in third trimester   4. Dizziness     Plan: Discharge home. Pt stable at time of discharge.  Labor precautions and fetal kick counts Return to MAU as needed for s/s of Pre E or labor  Follow up as scheduled for prenatal visits    Allergies as of 08/02/2017   No Known Allergies     Medication List    STOP taking these medications   BONJESTA 20-20 MG Tbcr Generic drug:  Doxylamine-Pyridoxine ER     TAKE these medications   acetaminophen 500 MG tablet Commonly known as:  TYLENOL Take 1,000 mg every 6 (six) hours as needed by mouth for mild pain or headache.   COMFORT FIT MATERNITY SUPP LG Misc 1 Device daily by Does not apply route.   cyclobenzaprine 10  MG tablet Commonly known as:  FLEXERIL Take 10 mg by mouth 2 (two) times daily as needed for muscle spasms (2tab at a time).   IRON PO Take by mouth.   ondansetron 4 MG disintegrating tablet Commonly known as:  ZOFRAN ODT 4mg  ODT q4 hours prn nausea/vomit   prenatal multivitamin Tabs tablet Take 1 tablet daily at 12 noon by mouth.   VENTOLIN HFA 108 (90 Base) MCG/ACT inhaler Generic drug:  albuterol USE 2 PUFFS AS NEEDED EVERY 6 HRS INHALATION 30 DAYS       Steward DroneVeronica Telitha Plath Certified Nurse-Midwife 08/02/2017 11:55 PM

## 2017-08-03 ENCOUNTER — Encounter (HOSPITAL_COMMUNITY)
Admission: RE | Admit: 2017-08-03 | Discharge: 2017-08-03 | Disposition: A | Discharge status: Home / Self Care | Payer: BLUE CROSS/BLUE SHIELD | Source: Ambulatory Visit

## 2017-08-03 ENCOUNTER — Encounter (HOSPITAL_COMMUNITY): Payer: Self-pay

## 2017-08-04 ENCOUNTER — Encounter (HOSPITAL_COMMUNITY): Payer: Self-pay

## 2017-08-04 ENCOUNTER — Encounter (HOSPITAL_COMMUNITY)
Admission: RE | Disposition: A | Discharge status: Home / Self Care | Payer: Self-pay | Source: Ambulatory Visit | Attending: Obstetrics and Gynecology

## 2017-08-04 ENCOUNTER — Inpatient Hospital Stay (HOSPITAL_COMMUNITY)
Admission: RE | Admit: 2017-08-04 | Discharge: 2017-08-07 | DRG: 788 | Disposition: A | Discharge status: Home / Self Care | Payer: BLUE CROSS/BLUE SHIELD | Source: Ambulatory Visit | Attending: Obstetrics and Gynecology | Admitting: Obstetrics and Gynecology

## 2017-08-04 ENCOUNTER — Inpatient Hospital Stay (HOSPITAL_COMMUNITY): Payer: BLUE CROSS/BLUE SHIELD | Admitting: Anesthesiology

## 2017-08-04 ENCOUNTER — Other Ambulatory Visit: Payer: Self-pay

## 2017-08-04 DIAGNOSIS — O9902 Anemia complicating childbirth: Secondary | ICD-10-CM | POA: Diagnosis present

## 2017-08-04 DIAGNOSIS — O99214 Obesity complicating childbirth: Secondary | ICD-10-CM | POA: Diagnosis present

## 2017-08-04 DIAGNOSIS — Z23 Encounter for immunization: Secondary | ICD-10-CM | POA: Diagnosis not present

## 2017-08-04 DIAGNOSIS — D649 Anemia, unspecified: Secondary | ICD-10-CM | POA: Diagnosis present

## 2017-08-04 DIAGNOSIS — O1404 Mild to moderate pre-eclampsia, complicating childbirth: Secondary | ICD-10-CM | POA: Diagnosis present

## 2017-08-04 DIAGNOSIS — Z3A38 38 weeks gestation of pregnancy: Secondary | ICD-10-CM | POA: Diagnosis not present

## 2017-08-04 DIAGNOSIS — O321XX0 Maternal care for breech presentation, not applicable or unspecified: Secondary | ICD-10-CM | POA: Diagnosis present

## 2017-08-04 DIAGNOSIS — J45909 Unspecified asthma, uncomplicated: Secondary | ICD-10-CM | POA: Diagnosis present

## 2017-08-04 LAB — COMPREHENSIVE METABOLIC PANEL
ALK PHOS: 82 U/L (ref 38–126)
ALT: 16 U/L (ref 14–54)
AST: 30 U/L (ref 15–41)
Albumin: 2.5 g/dL — ABNORMAL LOW (ref 3.5–5.0)
Anion gap: 7 (ref 5–15)
BILIRUBIN TOTAL: 0.6 mg/dL (ref 0.3–1.2)
BUN: 10 mg/dL (ref 6–20)
CALCIUM: 8 mg/dL — AB (ref 8.9–10.3)
CO2: 22 mmol/L (ref 22–32)
Chloride: 105 mmol/L (ref 101–111)
Creatinine, Ser: 0.62 mg/dL (ref 0.44–1.00)
GFR calc non Af Amer: >60 mL/min (ref >60)
GLUCOSE: 74 mg/dL (ref 65–99)
Potassium: 4 mmol/L (ref 3.5–5.1)
Sodium: 134 mmol/L — ABNORMAL LOW (ref 135–145)
TOTAL PROTEIN: 5.4 g/dL — AB (ref 6.5–8.1)

## 2017-08-04 LAB — CBC
HCT: 30.4 % — ABNORMAL LOW (ref 36.0–46.0)
HEMATOCRIT: 28.5 % — AB (ref 36.0–46.0)
HEMOGLOBIN: 9.6 g/dL — AB (ref 12.0–15.0)
Hemoglobin: 10.2 g/dL — ABNORMAL LOW (ref 12.0–15.0)
MCH: 28.7 pg (ref 26.0–34.0)
MCH: 29.1 pg (ref 26.0–34.0)
MCHC: 33.6 g/dL (ref 30.0–36.0)
MCHC: 33.7 g/dL (ref 30.0–36.0)
MCV: 85.4 fL (ref 78.0–100.0)
MCV: 86.4 fL (ref 78.0–100.0)
PLATELETS: 163 10*3/uL (ref 150–400)
Platelets: 140 10*3/uL — ABNORMAL LOW (ref 150–400)
RBC: 3.56 MIL/uL — AB (ref 3.87–5.11)
RBC: 3.3 MIL/uL — ABNORMAL LOW (ref 3.87–5.11)
RDW: 13.9 % (ref 11.5–15.5)
RDW: 13.9 % (ref 11.5–15.5)
WBC: 9.3 10*3/uL (ref 4.0–10.5)
WBC: 13.2 10*3/uL — ABNORMAL HIGH (ref 4.0–10.5)

## 2017-08-04 LAB — TYPE AND SCREEN
ABO/RH(D): A POS
Antibody Screen: NEGATIVE

## 2017-08-04 SURGERY — Surgical Case
Anesthesia: Monitor Anesthesia Care

## 2017-08-04 MED ORDER — FENTANYL CITRATE (PF) 250 MCG/5ML IJ SOLN
INTRAMUSCULAR | Status: AC
  Filled 2017-08-04: qty 5

## 2017-08-04 MED ORDER — FENTANYL CITRATE (PF) 100 MCG/2ML IJ SOLN
INTRAMUSCULAR | Status: AC
Start: 2017-08-04 — End: ?
  Filled 2017-08-04: qty 2

## 2017-08-04 MED ORDER — SCOPOLAMINE 1 MG/3DAYS TD PT72: 1.0000 | MEDICATED_PATCH | Freq: Once | TRANSDERMAL | Status: DC

## 2017-08-04 MED ORDER — ACETAMINOPHEN 325 MG PO TABS
650.0000 mg | ORAL_TABLET | ORAL | Status: DC | PRN
  Administered 2017-08-05: 650 mg via ORAL
  Filled 2017-08-04: qty 2

## 2017-08-04 MED ORDER — NALBUPHINE HCL 10 MG/ML IJ SOLN: 5.0000 mg | INTRAMUSCULAR | Status: DC | PRN

## 2017-08-04 MED ORDER — FENTANYL CITRATE (PF) 250 MCG/5ML IJ SOLN
INTRAMUSCULAR | Status: DC | PRN
  Administered 2017-08-04: 250 ug via INTRAVENOUS
  Administered 2017-08-04: 100 ug via INTRAVENOUS

## 2017-08-04 MED ORDER — HYDROMORPHONE HCL 1 MG/ML IJ SOLN
INTRAMUSCULAR | Status: AC
  Filled 2017-08-04: qty 0.5

## 2017-08-04 MED ORDER — DIBUCAINE 1 % RE OINT: 1.0000 "application " | TOPICAL_OINTMENT | RECTAL | Status: DC | PRN

## 2017-08-04 MED ORDER — SUGAMMADEX SODIUM 200 MG/2ML IV SOLN
INTRAVENOUS | Status: DC | PRN
  Administered 2017-08-04: 200 mg via INTRAVENOUS

## 2017-08-04 MED ORDER — ZOLPIDEM TARTRATE 5 MG PO TABS: 5.0000 mg | ORAL_TABLET | Freq: Every evening | ORAL | Status: DC | PRN

## 2017-08-04 MED ORDER — SODIUM CHLORIDE 0.9 % IR SOLN
Status: DC | PRN
  Administered 2017-08-04: 1

## 2017-08-04 MED ORDER — HYDROMORPHONE HCL 1 MG/ML IJ SOLN
INTRAMUSCULAR | Status: AC
  Filled 2017-08-04: qty 1

## 2017-08-04 MED ORDER — MEPERIDINE HCL 25 MG/ML IJ SOLN
INTRAMUSCULAR | Status: AC
  Filled 2017-08-04: qty 1

## 2017-08-04 MED ORDER — TETANUS-DIPHTH-ACELL PERTUSSIS 5-2.5-18.5 LF-MCG/0.5 IM SUSP: 0.5000 mL | Freq: Once | INTRAMUSCULAR | Status: DC

## 2017-08-04 MED ORDER — OXYTOCIN 10 UNIT/ML IJ SOLN
INTRAMUSCULAR | Status: AC
  Filled 2017-08-04: qty 4

## 2017-08-04 MED ORDER — NALOXONE HCL 0.4 MG/ML IJ SOLN: 0.4000 mg | INTRAMUSCULAR | Status: DC | PRN

## 2017-08-04 MED ORDER — DIPHENHYDRAMINE HCL 25 MG PO CAPS
25.0000 mg | ORAL_CAPSULE | ORAL | Status: DC | PRN
  Filled 2017-08-04: qty 1

## 2017-08-04 MED ORDER — PROPOFOL 10 MG/ML IV BOLUS
INTRAVENOUS | Status: AC
  Filled 2017-08-04: qty 20

## 2017-08-04 MED ORDER — SUCCINYLCHOLINE CHLORIDE 200 MG/10ML IV SOSY
PREFILLED_SYRINGE | INTRAVENOUS | Status: AC
  Filled 2017-08-04: qty 10

## 2017-08-04 MED ORDER — NALBUPHINE HCL 10 MG/ML IJ SOLN: 5.0000 mg | Freq: Once | INTRAMUSCULAR | Status: DC | PRN

## 2017-08-04 MED ORDER — HYDROMORPHONE HCL 1 MG/ML IJ SOLN
INTRAMUSCULAR | Status: AC
Start: 2017-08-04 — End: 2017-08-05
  Filled 2017-08-04: qty 1

## 2017-08-04 MED ORDER — SENNOSIDES-DOCUSATE SODIUM 8.6-50 MG PO TABS
2.0000 | ORAL_TABLET | ORAL | Status: DC
  Administered 2017-08-04 – 2017-08-06 (×3): 2 via ORAL
  Filled 2017-08-04 (×3): qty 2

## 2017-08-04 MED ORDER — ONDANSETRON HCL 4 MG/2ML IJ SOLN
INTRAMUSCULAR | Status: DC | PRN
  Administered 2017-08-04: 4 mg via INTRAVENOUS

## 2017-08-04 MED ORDER — SIMETHICONE 80 MG PO CHEW
80.0000 mg | CHEWABLE_TABLET | ORAL | Status: DC
  Administered 2017-08-04 – 2017-08-06 (×3): 80 mg via ORAL
  Filled 2017-08-04 (×3): qty 1

## 2017-08-04 MED ORDER — LACTATED RINGERS IV SOLN
INTRAVENOUS | Status: DC
Start: 2017-08-04 — End: 2017-08-07
  Administered 2017-08-04: 23:00:00 via INTRAVENOUS

## 2017-08-04 MED ORDER — PRENATAL MULTIVITAMIN CH
1.0000 | ORAL_TABLET | Freq: Every day | ORAL | Status: DC
  Administered 2017-08-05 – 2017-08-07 (×3): 1 via ORAL
  Filled 2017-08-04 (×3): qty 1

## 2017-08-04 MED ORDER — WITCH HAZEL-GLYCERIN EX PADS: 1.0000 "application " | MEDICATED_PAD | CUTANEOUS | Status: DC | PRN

## 2017-08-04 MED ORDER — ROCURONIUM BROMIDE 100 MG/10ML IV SOLN
INTRAVENOUS | Status: AC
  Filled 2017-08-04: qty 1

## 2017-08-04 MED ORDER — IBUPROFEN 600 MG PO TABS
600.0000 mg | ORAL_TABLET | Freq: Four times a day (QID) | ORAL | Status: DC
  Administered 2017-08-04 – 2017-08-07 (×11): 600 mg via ORAL
  Filled 2017-08-04 (×11): qty 1

## 2017-08-04 MED ORDER — DIPHENHYDRAMINE HCL 50 MG/ML IJ SOLN: 12.5000 mg | INTRAMUSCULAR | Status: DC | PRN

## 2017-08-04 MED ORDER — OXYCODONE HCL 5 MG PO TABS
10.0000 mg | ORAL_TABLET | ORAL | Status: DC | PRN
  Administered 2017-08-05: 10 mg via ORAL
  Filled 2017-08-04: qty 2

## 2017-08-04 MED ORDER — OXYTOCIN 10 UNIT/ML IJ SOLN
INTRAMUSCULAR | Status: DC | PRN
  Administered 2017-08-04: 40 [IU] via INTRAVENOUS

## 2017-08-04 MED ORDER — NALOXONE HCL 4 MG/10ML IJ SOLN
1.0000 ug/kg/h | INTRAVENOUS | Status: DC | PRN
  Filled 2017-08-04: qty 5

## 2017-08-04 MED ORDER — HYDROMORPHONE HCL 1 MG/ML IJ SOLN
0.2500 mg | INTRAMUSCULAR | Status: DC | PRN
  Administered 2017-08-04 (×4): 0.5 mg via INTRAVENOUS

## 2017-08-04 MED ORDER — ROCURONIUM BROMIDE 100 MG/10ML IV SOLN
INTRAVENOUS | Status: DC | PRN
  Administered 2017-08-04: 20 mg via INTRAVENOUS

## 2017-08-04 MED ORDER — SIMETHICONE 80 MG PO CHEW
80.0000 mg | CHEWABLE_TABLET | Freq: Three times a day (TID) | ORAL | Status: DC
  Administered 2017-08-05 – 2017-08-07 (×7): 80 mg via ORAL
  Filled 2017-08-04 (×7): qty 1

## 2017-08-04 MED ORDER — PROMETHAZINE HCL 25 MG/ML IJ SOLN: 6.2500 mg | INTRAMUSCULAR | Status: DC | PRN

## 2017-08-04 MED ORDER — DIPHENHYDRAMINE HCL 25 MG PO CAPS: 25.0000 mg | ORAL_CAPSULE | Freq: Four times a day (QID) | ORAL | Status: DC | PRN

## 2017-08-04 MED ORDER — MORPHINE SULFATE (PF) 0.5 MG/ML IJ SOLN
INTRAMUSCULAR | Status: AC
  Filled 2017-08-04: qty 10

## 2017-08-04 MED ORDER — SODIUM CHLORIDE 0.9 % IV SOLN
INTRAVENOUS | Status: DC
  Administered 2017-08-04: 13:00:00 via INTRAVENOUS

## 2017-08-04 MED ORDER — SOD CITRATE-CITRIC ACID 500-334 MG/5ML PO SOLN
30.0000 mL | ORAL | Status: AC
  Administered 2017-08-04: 30 mL via ORAL
  Filled 2017-08-04: qty 15

## 2017-08-04 MED ORDER — ONDANSETRON HCL 4 MG/2ML IJ SOLN
INTRAMUSCULAR | Status: AC
  Filled 2017-08-04: qty 2

## 2017-08-04 MED ORDER — MIDAZOLAM HCL 2 MG/2ML IJ SOLN
INTRAMUSCULAR | Status: AC
  Filled 2017-08-04: qty 2

## 2017-08-04 MED ORDER — PNEUMOCOCCAL VAC POLYVALENT 25 MCG/0.5ML IJ INJ
0.5000 mL | INJECTION | INTRAMUSCULAR | Status: AC
  Administered 2017-08-07: 0.5 mL via INTRAMUSCULAR
  Filled 2017-08-04 (×2): qty 0.5

## 2017-08-04 MED ORDER — COCONUT OIL OIL: 1.0000 "application " | TOPICAL_OIL | Status: DC | PRN

## 2017-08-04 MED ORDER — CEFAZOLIN SODIUM 10 G IJ SOLR
3.0000 g | INTRAMUSCULAR | Status: AC
  Administered 2017-08-04: 3 g via INTRAVENOUS
  Filled 2017-08-04: qty 3

## 2017-08-04 MED ORDER — ONDANSETRON HCL 4 MG/2ML IJ SOLN: 4.0000 mg | Freq: Three times a day (TID) | INTRAMUSCULAR | Status: DC | PRN

## 2017-08-04 MED ORDER — SIMETHICONE 80 MG PO CHEW: 80.0000 mg | CHEWABLE_TABLET | ORAL | Status: DC | PRN

## 2017-08-04 MED ORDER — HYDROMORPHONE HCL 1 MG/ML IJ SOLN
INTRAMUSCULAR | Status: DC | PRN
  Administered 2017-08-04 (×3): 1 mg via INTRAVENOUS

## 2017-08-04 MED ORDER — SODIUM CHLORIDE 0.9% FLUSH: 3.0000 mL | INTRAVENOUS | Status: DC | PRN

## 2017-08-04 MED ORDER — OXYCODONE HCL 5 MG PO TABS
5.0000 mg | ORAL_TABLET | ORAL | Status: DC | PRN
  Administered 2017-08-04 – 2017-08-05 (×2): 5 mg via ORAL
  Filled 2017-08-04 (×2): qty 1

## 2017-08-04 MED ORDER — LACTATED RINGERS IV SOLN
INTRAVENOUS | Status: DC | PRN
  Administered 2017-08-04: 14:00:00 via INTRAVENOUS

## 2017-08-04 MED ORDER — OXYTOCIN 40 UNITS IN LACTATED RINGERS INFUSION - SIMPLE MED: 2.5000 [IU]/h | INTRAVENOUS | Status: AC

## 2017-08-04 MED ORDER — SUCCINYLCHOLINE CHLORIDE 20 MG/ML IJ SOLN
INTRAMUSCULAR | Status: DC | PRN
  Administered 2017-08-04: 120 mg via INTRAVENOUS

## 2017-08-04 MED ORDER — MENTHOL 3 MG MT LOZG: 1.0000 | LOZENGE | OROMUCOSAL | Status: DC | PRN

## 2017-08-04 MED ORDER — MEPERIDINE HCL 25 MG/ML IJ SOLN
6.2500 mg | INTRAMUSCULAR | Status: DC | PRN
  Administered 2017-08-04: 6.25 mg via INTRAVENOUS

## 2017-08-04 MED ORDER — MIDAZOLAM HCL 2 MG/2ML IJ SOLN
INTRAMUSCULAR | Status: DC | PRN
  Administered 2017-08-04: 2 mg via INTRAVENOUS

## 2017-08-04 MED ORDER — PROPOFOL 10 MG/ML IV BOLUS
INTRAVENOUS | Status: DC | PRN
  Administered 2017-08-04: 150 mg via INTRAVENOUS

## 2017-08-04 MED ORDER — LACTATED RINGERS IV SOLN
INTRAVENOUS | Status: DC | PRN
  Administered 2017-08-04 (×2): via INTRAVENOUS

## 2017-08-04 MED ORDER — PHENYLEPHRINE 8 MG IN D5W 100 ML (0.08MG/ML) PREMIX OPTIME
INJECTION | INTRAVENOUS | Status: AC
  Filled 2017-08-04: qty 100

## 2017-08-04 SURGICAL SUPPLY — 34 items
BENZOIN TINCTURE PRP APPL 2/3 (GAUZE/BANDAGES/DRESSINGS) ×3 IMPLANT
CHLORAPREP W/TINT 26ML (MISCELLANEOUS) ×3 IMPLANT
CLAMP CORD UMBIL (MISCELLANEOUS) IMPLANT
CLOSURE WOUND 1/2 X4 (GAUZE/BANDAGES/DRESSINGS) ×1
CLOTH BEACON ORANGE TIMEOUT ST (SAFETY) ×3 IMPLANT
DERMABOND ADVANCED (GAUZE/BANDAGES/DRESSINGS)
DERMABOND ADVANCED .7 DNX12 (GAUZE/BANDAGES/DRESSINGS) IMPLANT
DRSG OPSITE POSTOP 4X10 (GAUZE/BANDAGES/DRESSINGS) ×3 IMPLANT
ELECT REM PT RETURN 9FT ADLT (ELECTROSURGICAL) ×3
ELECTRODE REM PT RTRN 9FT ADLT (ELECTROSURGICAL) ×1 IMPLANT
EXTRACTOR VACUUM M CUP 4 TUBE (SUCTIONS) IMPLANT
EXTRACTOR VACUUM M CUP 4' TUBE (SUCTIONS)
GLOVE BIO SURGEON STRL SZ7.5 (GLOVE) ×3 IMPLANT
GLOVE BIOGEL PI IND STRL 7.0 (GLOVE) ×1 IMPLANT
GLOVE BIOGEL PI INDICATOR 7.0 (GLOVE) ×2
GOWN STRL REUS W/TWL LRG LVL3 (GOWN DISPOSABLE) ×6 IMPLANT
KIT ABG SYR 3ML LUER SLIP (SYRINGE) ×3 IMPLANT
NEEDLE HYPO 25X5/8 SAFETYGLIDE (NEEDLE) ×3 IMPLANT
NS IRRIG 1000ML POUR BTL (IV SOLUTION) ×3 IMPLANT
PACK C SECTION WH (CUSTOM PROCEDURE TRAY) ×3 IMPLANT
PAD ABD 7.5X8 STRL (GAUZE/BANDAGES/DRESSINGS) ×6 IMPLANT
PAD OB MATERNITY 4.3X12.25 (PERSONAL CARE ITEMS) ×3 IMPLANT
PENCIL SMOKE EVAC W/HOLSTER (ELECTROSURGICAL) ×3 IMPLANT
STRIP CLOSURE SKIN 1/2X4 (GAUZE/BANDAGES/DRESSINGS) ×2 IMPLANT
SUT MNCRL 0 VIOLET CTX 36 (SUTURE) ×4 IMPLANT
SUT MONOCRYL 0 CTX 36 (SUTURE) ×8
SUT PDS AB 0 CTX 60 (SUTURE) ×3 IMPLANT
SUT PLAIN 0 NONE (SUTURE) IMPLANT
SUT PLAIN 2 0 (SUTURE)
SUT PLAIN 2 0 XLH (SUTURE) IMPLANT
SUT PLAIN ABS 2-0 CT1 27XMFL (SUTURE) IMPLANT
SUT VIC AB 4-0 KS 27 (SUTURE) ×3 IMPLANT
TOWEL OR 17X24 6PK STRL BLUE (TOWEL DISPOSABLE) ×3 IMPLANT
TRAY FOLEY BAG SILVER LF 14FR (SET/KITS/TRAYS/PACK) ×3 IMPLANT

## 2017-08-04 NOTE — Progress Notes (Signed)
Noted on assessment mom has hx of PCOS. Mom attempted to breast feed for 5 months with prior pregnancy. Stated she had to stop due to low milk supply. Baby has not successfully latched, LC was called. Skin to skin initated; RN taught and assisted with hand expression/spoon feeding. Approximately 2ml was expressed and given to baby. DEBP set up.   Jarl Sellitto L Gwendolynn Merkey, RN

## 2017-08-04 NOTE — Anesthesia Postprocedure Evaluation (Signed)
Anesthesia Post Note  Patient: Desiree Singh  Procedure(s) Performed: CESAREAN SECTION (N/A )     Patient location during evaluation: PACU Anesthesia Type: General Level of consciousness: sedated Pain management: pain level controlled Vital Signs Assessment: post-procedure vital signs reviewed and stable Respiratory status: spontaneous breathing and respiratory function stable Cardiovascular status: stable Postop Assessment: no apparent nausea or vomiting Anesthetic complications: no    Last Vitals:  Vitals:   08/04/17 1547 08/04/17 1600  BP: (!) 149/79 (!) 142/67  Pulse: 73 67  Resp: 15 18  Temp:  36.6 C  SpO2: 97% 98%    Last Pain:  Vitals:   08/04/17 1600  TempSrc: Oral  PainSc:    Pain Goal:                 [name] DANIEL

## 2017-08-04 NOTE — Lactation Note (Signed)
This note was copied from a baby's chart. Lactation Consultation Note  Patient Name: Girl Sylvie Farrieriffany Chenard ZOXWR'UToday's Date: 08/04/2017 Reason for consult: Initial assessment;Early term 4637-38.6wks  6 hours old early term female who is being exclusively BF by her mother; she's a P2. Mom has history of low milk supply, she didn't noticed any breast changes during her first or current pregnancy. Per mom the most she was able to pump with baby # 1 was 2 ounces out of both breasts when baby was 256 weeks old.  Mom was concerned that baby wasn't taking the right breast, only the left one. Tissue on both breasts is semi-compressible upon examination and both nipples look intact, no signs of trauma. Mom stated that the feedings at the breast were comfortable so far, but she had general anesthesia, will need to re-assess that tomorrow.  Baby still no able to latch on right breast, tried cross cradle and football hold without success; then switched to the left one and she was able to latch and sustain the latch, only a couple of swallows were heard though, baby was very sleepy.  Encouraged mom to wake baby up every 3 hours for feedings and do STS. She'll hand express prior pumping also every 3 hours and feed baby fresh expressed colostrum like she's been doing it. LC able to get some drops of colostrum doing hand expression, mom was very pleased.  Reviewed caring for your LPI baby, BF brochure, BF resources and feeding diary' mom is aware of LC services and will call PRN.  Maternal Data Formula Feeding for Exclusion: No Has patient been taught Hand Expression?: Yes Does the patient have breastfeeding experience prior to this delivery?: Yes  Feeding Feeding Type: Breast Fed Length of feed: 10 min(baby still feeding when exiting the room)  LATCH Score Latch: Repeated attempts needed to sustain latch, nipple held in mouth throughout feeding, stimulation needed to elicit sucking reflex.(Baby only able to latch on  left breast, no latch achieved on right breast)  Audible Swallowing: A few with stimulation  Type of Nipple: Everted at rest and after stimulation  Comfort (Breast/Nipple): Soft / non-tender  Hold (Positioning): Assistance needed to correctly position infant at breast and maintain latch.  LATCH Score: 7  Interventions Interventions: Breast feeding basics reviewed;Assisted with latch;Skin to skin;Breast massage;Hand express;Breast compression;Adjust position;Support pillows;Position options;Expressed milk;DEBP  Lactation Tools Discussed/Used Tools: Pump Breast pump type: Double-Electric Breast Pump WIC Program: No Initiated by:: RN Date initiated:: 08/04/17   Consult Status Consult Status: Follow-up Date: 08/05/17 Follow-up type: In-patient    Aadhya Bustamante Venetia ConstableS Kalen Ratajczak 08/04/2017, 7:50 PM

## 2017-08-04 NOTE — H&P (Signed)
Desiree Singh is a 37 y.o. female presenting for cesarean section for breech. Over last 2-3 days has H/A of and on as well as BPs more labile 120-140s/80-90s. No epigastric pain. Occasional blurry vision. OB History    Gravida Para Term Preterm AB Living   3 1 1   1 1    SAB TAB Ectopic Multiple Live Births   1       1     Past Medical History:  Diagnosis Date  . Anxiety   . Asthma   . Headache   . Hypertension   . Hypothyroidism    2009-2012   Past Surgical History:  Procedure Laterality Date  . TYMPANOSTOMY TUBE PLACEMENT     Family History: family history includes Colon cancer in her maternal grandfather; Diabetes in her maternal aunt; Hypertension in her maternal aunt, maternal grandmother, and mother. Social History:  reports that  has never smoked. she has never used smokeless tobacco. She reports that she does not drink alcohol or use drugs.     Maternal Diabetes: No Genetic Screening: Normal Maternal Ultrasounds/Referrals: Normal Fetal Ultrasounds or other Referrals:  None Maternal Substance Abuse:  No Significant Maternal Medications:  None Significant Maternal Lab Results:  None Other Comments:  None  Review of Systems  Eyes: Positive for blurred vision.  Gastrointestinal: Negative for abdominal pain.  Neurological: Positive for headaches.   Maternal Medical History:  Fetal activity: Perceived fetal activity is normal.        Blood pressure (!) 122/52, pulse 66, temperature 98.3 F (36.8 C), temperature source Oral, resp. rate 16, height 5\' 10"  (1.778 m), weight 285 lb (129.3 kg), last menstrual period 11/08/2016. Maternal Exam:  Abdomen: Fetal presentation: breech     Physical Exam  Cardiovascular: Normal rate and regular rhythm.  Respiratory: Effort normal and breath sounds normal.  GI: Soft. There is no tenderness.  Neurological: She has normal reflexes.    Beside U/S confirms breech  Prenatal labs: ABO, Rh: --/--/A POS (09/16  1953) Antibody: Negative (08/06 0000) Rubella: Immune (08/06 0000) RPR: Nonreactive (08/06 0000)  HBsAg: Negative (08/06 0000)  HIV: Non-reactive (08/06 0000)  GBS:     Assessment/Plan: 37 yo G3P1 @ 38 2/7 weeks by firm conception date. Breech Mild preeclampsia D/W patient C/S including risks- infection, organ damage, bleeding/transfusion-HIV/Hep, DVT/PE, pneumonia. Patient states she understands and agrees   Desiree Singh 08/04/2017, 1:12 PM

## 2017-08-04 NOTE — Anesthesia Preprocedure Evaluation (Addendum)
Anesthesia Evaluation  Patient identified by MRN, date of birth, ID band Patient awake    Reviewed: Allergy & Precautions, NPO status , Patient's Chart, lab work & pertinent test results  Airway Mallampati: II  TM Distance: >3 FB Neck ROM: Full    Dental no notable dental hx. (+) Dental Advisory Given   Pulmonary asthma ,    Pulmonary exam normal        Cardiovascular hypertension, negative cardio ROS Normal cardiovascular exam     Neuro/Psych Anxiety negative neurological ROS     GI/Hepatic negative GI ROS, Neg liver ROS,   Endo/Other  Hypothyroidism Morbid obesity  Renal/GU negative Renal ROS     Musculoskeletal negative musculoskeletal ROS (+)   Abdominal   Peds  Hematology negative hematology ROS (+)   Anesthesia Other Findings Day of surgery medications reviewed with the patient.  Reproductive/Obstetrics (+) Pregnancy                            Anesthesia Physical Anesthesia Plan  ASA: III  Anesthesia Plan: Spinal and MAC   Post-op Pain Management:    Induction:   PONV Risk Score and Plan: 2 and Ondansetron and Scopolamine patch - Pre-op  Airway Management Planned: Natural Airway, Nasal Cannula and Simple Face Mask  Additional Equipment:   Intra-op Plan:   Post-operative Plan:   Informed Consent: I have reviewed the patients History and Physical, chart, labs and discussed the procedure including the risks, benefits and alternatives for the proposed anesthesia with the patient or authorized representative who has indicated his/her understanding and acceptance.   Dental advisory given  Plan Discussed with: Anesthesiologist  Anesthesia Plan Comments:        Anesthesia Quick Evaluation

## 2017-08-04 NOTE — Brief Op Note (Signed)
08/04/2017  2:26 PM  PATIENT:  Sylvie Farrieriffany Ratliff  37 y.o. female  PRE-OPERATIVE DIAGNOSIS:  BREECH; mild preeclampsia   POST-OPERATIVE DIAGNOSIS:  BREECH; mild preeclampsia   PROCEDURE:  Procedure(s): CESAREAN SECTION (N/A)  SURGEON:  Surgeon(s) and Role:    * Harold Hedgeomblin, Quincie Haroon, MD - Primary  PHYSICIAN ASSISTANT:   ASSISTANTS: none   ANESTHESIA:   general  EBL:  876 mL   BLOOD ADMINISTERED:none  DRAINS: Urinary Catheter (Foley)   LOCAL MEDICATIONS USED:  NONE  SPECIMEN:  Source of Specimen:  placenta  DISPOSITION OF SPECIMEN:  PATHOLOGY  COUNTS:  YES  TOURNIQUET:  * No tourniquets in log *  DICTATION: .Other Dictation: Dictation Number [id number]  PLAN OF CARE: Admit to inpatient   PATIENT DISPOSITION:  PACU - hemodynamically stable.   Delay start of Pharmacological VTE agent (>24hrs) due to surgical blood loss or risk of bleeding: not applicable

## 2017-08-04 NOTE — Transfer of Care (Signed)
Immediate Anesthesia Transfer of Care Note  Patient: Desiree Singh  Procedure(s) Performed: CESAREAN SECTION (N/A )  Patient Location: PACU  Anesthesia Type:General  Level of Consciousness: awake, alert  and oriented  Airway & Oxygen Therapy: Patient Spontanous Breathing and Patient connected to nasal cannula oxygen  Post-op Assessment: Report given to RN and Post -op Vital signs reviewed and stable  Post vital signs: Reviewed and stable  Last Vitals:  Vitals:   08/04/17 1104  BP: (!) 122/52  Pulse: 66  Resp: 16  Temp: 36.8 C    Last Pain:  Vitals:   08/04/17 1111  TempSrc:   PainSc: 3          Complications: No apparent anesthesia complications

## 2017-08-04 NOTE — Anesthesia Procedure Notes (Signed)
Procedure Name: Intubation Date/Time: 08/04/2017 1:40 PM Performed by: Jonna Munro, CRNA Pre-anesthesia Checklist: Patient identified, Emergency Drugs available, Suction available, Patient being monitored and Timeout performed Patient Re-evaluated:Patient Re-evaluated prior to induction Oxygen Delivery Method: Circle system utilized Preoxygenation: Pre-oxygenation with 100% oxygen Induction Type: IV induction, Rapid sequence and Cricoid Pressure applied Laryngoscope Size: Mac and 3 Grade View: Grade I Tube type: Oral Tube size: 7.0 mm Number of attempts: 1 Airway Equipment and Method: Stylet Placement Confirmation: ETT inserted through vocal cords under direct vision,  positive ETCO2 and breath sounds checked- equal and bilateral Secured at: 21 cm Tube secured with: Tape Dental Injury: Teeth and Oropharynx as per pre-operative assessment

## 2017-08-05 ENCOUNTER — Encounter (HOSPITAL_COMMUNITY): Payer: Self-pay | Admitting: Obstetrics and Gynecology

## 2017-08-05 LAB — CBC
HCT: 24.8 % — ABNORMAL LOW (ref 36.0–46.0)
Hemoglobin: 8.5 g/dL — ABNORMAL LOW (ref 12.0–15.0)
MCH: 29.3 pg (ref 26.0–34.0)
MCHC: 34.3 g/dL (ref 30.0–36.0)
MCV: 85.5 fL (ref 78.0–100.0)
PLATELETS: 122 10*3/uL — AB (ref 150–400)
RBC: 2.9 MIL/uL — ABNORMAL LOW (ref 3.87–5.11)
RDW: 14 % (ref 11.5–15.5)
WBC: 8.1 10*3/uL (ref 4.0–10.5)

## 2017-08-05 LAB — RPR: RPR: NONREACTIVE

## 2017-08-05 MED ORDER — FERROUS SULFATE 325 (65 FE) MG PO TABS
325.0000 mg | ORAL_TABLET | Freq: Every day | ORAL | Status: DC
  Administered 2017-08-05 – 2017-08-07 (×3): 325 mg via ORAL
  Filled 2017-08-05 (×3): qty 1

## 2017-08-05 MED ORDER — HYDROMORPHONE HCL 1 MG/ML IJ SOLN
1.0000 mg | Freq: Once | INTRAMUSCULAR | Status: AC
  Administered 2017-08-05: 1 mg via INTRAVENOUS
  Filled 2017-08-05: qty 1

## 2017-08-05 MED ORDER — OXYCODONE-ACETAMINOPHEN 5-325 MG PO TABS
2.0000 | ORAL_TABLET | ORAL | Status: DC | PRN
  Administered 2017-08-05 – 2017-08-07 (×10): 2 via ORAL
  Filled 2017-08-05 (×10): qty 2

## 2017-08-05 MED ORDER — OXYCODONE-ACETAMINOPHEN 5-325 MG PO TABS: 1.0000 | ORAL_TABLET | ORAL | Status: DC | PRN

## 2017-08-05 MED ORDER — LORATADINE 10 MG PO TABS
10.0000 mg | ORAL_TABLET | Freq: Every day | ORAL | Status: DC
  Administered 2017-08-05 – 2017-08-06 (×2): 10 mg via ORAL
  Filled 2017-08-05 (×3): qty 1

## 2017-08-05 NOTE — Progress Notes (Signed)
CC: Headache  HPI: 36yo WF POD1 s/p C-section who complains of headache that started around 3am this morning.  Had a c-section yesterday under general anesthesia after unable to cooperate during attempted spinal placement.  No evidence of violation of intrathecal space during attempted spinal placement.  Tolerated general anesthesia well with no complications.  Describes a headache that is right sided, worse if sitting upright quickly.  Denies photophobia, blurred vision, neck stiffness.  Does endorse periodic sinus headaches, but denies migraines.  PE: Pleasant female sitting up in bed. PERRLA, FROM neck CTAB, RRR  Assessment: 36yo POD1 s/p C-section with headache.   -Discussed with patient that given no evidence of intrathecal puncture during attempted spinal and timing of onset of headache that this is a low likelihood of being a post dural puncture headache. -Recommend conservative treatment of headache including aggressive PO hydration, caffeine intake, and PO meds.  Could try fioricet. -Discussed that if headache worsens or patient notices a change in symptoms that she can ask to have further evaluation by anesthesia.  Please feel free to contact with any further questions.  Arrie AranStephen Zayyan Mullen, MD Anesthesiology

## 2017-08-05 NOTE — Progress Notes (Signed)
CSW acknowledges consult.  CSW attempted to meet with MOB, however MOB had several room guest.  CSW will attempt to visit with MOB on tomorrow (08/06/2017).  Dontarious Schaum Boyd-Gilyard, MSW, LCSW Clinical Social Work [phone]  

## 2017-08-05 NOTE — Anesthesia Postprocedure Evaluation (Signed)
Anesthesia Post Note  Patient: Desiree Singh  Procedure(s) Performed: CESAREAN SECTION (N/A )     Patient location during evaluation: Mother Baby Anesthesia Type: MAC and General Level of consciousness: awake and alert Pain management: pain level controlled Vital Signs Assessment: post-procedure vital signs reviewed and stable Respiratory status: spontaneous breathing Cardiovascular status: stable Anesthetic complications: no    Last Vitals:  Vitals:   08/05/17 0300 08/05/17 0609  BP: 139/72 124/73  Pulse: 88 84  Resp: 18 18  Temp: 36.8 C 36.9 C  SpO2: 98% 99%    Last Pain:  Vitals:   08/05/17 0609  TempSrc: Oral  PainSc: 7    Pain Goal: Patients Stated Pain Goal: 3 (08/04/17 2111)               Ailene Ards

## 2017-08-05 NOTE — Progress Notes (Signed)
Dr. Renaldo FiddlerAdkins informed that patient was ambulated to the bathroom at 1030. She complained of incisional pain at a level 8 at that time, moving very slowly with a great deal of discomfort. I had medicated her for pain at 0939 with 10 mg. Oxycodone and 650 mg. acetominophen. She has been receiving ibuprofen 600 mg. PO every 6 hours since admission to [facility]. Dr. Renaldo FiddlerAdkins adjusted pain medication regime. Please refer to the new orders.

## 2017-08-05 NOTE — Progress Notes (Signed)
Subjective: Postpartum Day 1: Cesarean Delivery Patient reports headache that began around 3am.  Dull HA starts in base of head and radiates around to forehead.  Increase when standing to ambulate.  No n/v or visual changes.  No improvement with ibuprofen.   Objective: Vital signs in last 24 hours: Temp:  [97.7 F (36.5 C)-98.5 F (36.9 C)] 98.5 F (36.9 C) (03/02 0609) Pulse Rate:  [58-97] 84 (03/02 0609) Resp:  [10-19] 18 (03/02 0609) BP: (119-149)/(52-88) 124/73 (03/02 0609) SpO2:  [96 %-99 %] 99 % (03/02 0609) Weight:  [285 lb (129.3 kg)] 285 lb (129.3 kg) (03/01 1111)  Physical Exam:  General: alert and cooperative Lochia: appropriate Uterine Fundus: firm Incision: no significant drainage DVT Evaluation: No evidence of DVT seen on physical exam. DTR 1+, no clonus  Recent Labs    08/04/17 1745 08/05/17 0522  HGB 9.6* 8.5*  HCT 28.5* 24.8*    Assessment/Plan: Status post Cesarean section. Doing well postoperatively.  Continue current care. Will have anesthesia evaluate for spinal HA.   Anemia - will add iron supp  Zelphia CairoGretchen Tanekia Ryans 08/05/2017, 8:42 AM

## 2017-08-06 ENCOUNTER — Encounter (HOSPITAL_COMMUNITY): Payer: Self-pay | Admitting: Obstetrics and Gynecology

## 2017-08-06 MED ORDER — OXYCODONE HCL 5 MG PO TABS
5.0000 mg | ORAL_TABLET | Freq: Once | ORAL | Status: AC
  Administered 2017-08-06: 5 mg via ORAL
  Filled 2017-08-06: qty 1

## 2017-08-06 MED ORDER — BISACODYL 10 MG RE SUPP: 10.0000 mg | Freq: Every day | RECTAL | Status: DC | PRN

## 2017-08-06 NOTE — Progress Notes (Signed)
Mother of baby was referred for history of depression and anxiety. Referral screened out by CSW because patient reported that she has not experienced signs or symptoms of her anxiety since 2013.   Please contact the CSW if needs arise, if mother of baby requests, or if mother of baby scores greater than 9 or answers yes to question 10 on Edinburgh Postpartum Depression Screen.  Patient declined Postpartum Depression education.  Edwin Dadaarol Johnte Portnoy, MSW, LCSW-A Clinical Social Worker [facility] [phone]

## 2017-08-06 NOTE — Progress Notes (Signed)
Subjective: Postpartum Day 2: Cesarean Delivery Patient reports tolerating PO and no problems voiding.  No flatus yet.  No n/v or abdominal pain  Objective: Vital signs in last 24 hours: Temp:  [98.1 F (36.7 C)-98.4 F (36.9 C)] 98.1 F (36.7 C) (03/03 0556) Pulse Rate:  [75-91] 85 (03/03 0556) Resp:  [16-18] 18 (03/03 0556) BP: (120-153)/(65-81) 125/74 (03/03 0556) SpO2:  [99 %-100 %] 100 % (03/03 0556)  Physical Exam:  General: alert and cooperative Lochia: appropriate Uterine Fundus: firm Incision: healing well, no significant drainage DVT Evaluation: No evidence of DVT seen on physical exam.  Recent Labs    08/04/17 1745 08/05/17 0522  HGB 9.6* 8.5*  HCT 28.5* 24.8*    Assessment/Plan: Status post Cesarean section. Doing well postoperatively.  Continue current care.  Zelphia CairoGretchen Armeda Plumb 08/06/2017, 7:39 AM

## 2017-08-07 MED ORDER — OXYCODONE HCL 5 MG PO TABS: 5.0000 mg | ORAL_TABLET | Freq: Four times a day (QID) | ORAL | 0 refills | Status: DC | PRN

## 2017-08-07 MED ORDER — IBUPROFEN 600 MG PO TABS: 600.0000 mg | ORAL_TABLET | Freq: Four times a day (QID) | ORAL | 1 refills | Status: DC | PRN

## 2017-08-07 MED ORDER — FERROUS SULFATE 325 (65 FE) MG PO TABS: 325.0000 mg | ORAL_TABLET | Freq: Every day | ORAL | 3 refills | Status: DC

## 2017-08-07 NOTE — Lactation Note (Signed)
This note was copied from a baby's chart. Lactation Consultation Note  Patient Name: Desiree Sylvie Farrieriffany Person [id number]'s Date: 08/07/2017 Reason for consult: Follow-up assessment   P2, Baby 68 hours old and latched upon entering. Mother has history of low milk supply. Discussed hands on pumping since mother is post pumping after feedings for extra stimulation.  She states she is receiving 3-4 ml with pumping. Mom encouraged to feed baby 8-12 times/24 hours and with feeding cues.  Encouraged STS, hand expression, compression and continue post pumping. Reviewed engorgement care and monitoring voids/stools.    Maternal Data    Feeding Feeding Type: Breast Fed  LATCH Score Latch: Grasps breast easily, tongue down, lips flanged, rhythmical sucking.  Audible Swallowing: Spontaneous and intermittent  Type of Nipple: Everted at rest and after stimulation  Comfort (Breast/Nipple): Soft / non-tender  Hold (Positioning): No assistance needed to correctly position infant at breast.  LATCH Score: 10  Interventions    Lactation Tools Discussed/Used     Consult Status Consult Status: Complete    Desiree Singh, Desiree Singh 08/07/2017, 10:26 AM

## 2017-08-07 NOTE — Op Note (Signed)
NAMEBRITTLEY, REGNER              ACCOUNT NO.:  [id number]  MEDICAL RECORD NO.:  [id number]  LOCATION:                                 FACILITY:  PHYSICIAN:  Guy Sandifer. Henderson Cloud, M.D. DATE OF BIRTH:  May 09, 1981  DATE OF PROCEDURE:  08/04/2017 DATE OF DISCHARGE:                              OPERATIVE REPORT   PREOPERATIVE DIAGNOSES: 1. Breech. 2. Mild preeclampsia.  POSTOPERATIVE DIAGNOSES: 1. Breech. 2. Mild preeclampsia.  PROCEDURE:  Primary low transverse cesarean section.  SURGEON:  Guy Sandifer. Henderson Cloud, M.D.  ANESTHESIA:  General with endotracheal intubation.  ESTIMATED BLOOD LOSS:  867 mL.  FINDINGS:  Viable female infant, Apgars pending, birth weight pending.  INDICATIONS AND CONSENT:  This patient is a 37 year old, G3, P1, with breech positioned baby.  She has also had increasingly labile blood pressures, mild headache, and blurry vision over the last 2 or 3 days. Cesarean section was discussed preoperatively and risks were reviewed including, but not limited to, infection, organ damage, bleeding requiring transfusion of blood products with HIV and hepatitis acquisition, DVT, PE, pneumonia.  The patient states she understands and agrees and consent was signed on the chart.  DESCRIPTION OF PROCEDURE:  The patient was taken to the operating room, where she was identified.  Attempts at spinal anesthetic by Anesthesia were unsuccessful.  Apparently, the patient was unable to tolerate it. Therefore, she was placed in the dorsal supine position with a 15-degree left lateral wedge.  She was prepped vaginally with Betadine.  Foley catheter was placed and then prepped abdominally with ChloraPrep.  Time- out was undertaken.  After 3-minute drying time, she was draped in a sterile fashion.  After we are ready for surgery, she undergoes general anesthesia with endotracheal intubation.  When the endotracheal tube is secure, she was incised in a low transverse manner and dissection  was carried out in layers.  There was a very heavy bleeder in the subcutaneous tissue, which requires brief cautery on the way in. Peritoneum was incised and bluntly extended.  The vesicouterine peritoneum was taken down and the bladder was rapidly advanced with a sponge.  Uterus was incised in a low transverse manner and the uterine cavity was entered bluntly.  The uterine incision was extended bilaterally with the fingers.  Clear fluid was noted.  Baby was then delivered from the frank breech position without difficulty.  Good cry and tone were noted.  Cord was clamped and cut and the baby was handed to awaiting Pediatrics team.  Placenta was manually delivered and sent to Pathology.  The uterus was exteriorized for better visualization. The uterus was closed in 2 running locking imbricating layers of 0 Monocryl suture, which achieves good hemostasis.  Tubes and ovaries were normal bilaterally.  Uterus involuted well.  Uterus was returned to the abdomen.  Lavage was carried out and all returns is clear and reinspection revealed good hemostasis.  Anterior peritoneum was closed in a running fashion with 0 Monocryl suture, which was also used to reapproximate the pyramidalis muscle in the midline.  Anterior rectus fascia was closed in a running fashion with a 0 looped PDS. Subcutaneous layer was closed with interrupted 2-0 plain and the  skin was closed in a subcuticular fashion with 4-0 Vicryl on a Keith needle. Benzoin, Steri-Strips, honeycomb dressing, and pressure dressing are applied.  All counts were correct.  The patient was awakened and taken to the recovery room in stable condition.     Guy SandiferJames E. Henderson Cloudomblin, M.D.     JET/MEDQ  D:  08/04/2017  T:  08/05/2017  Job:  [id number]

## 2017-08-07 NOTE — Discharge Summary (Signed)
Obstetric Discharge Summary Reason for Admission: cesarean section Prenatal Procedures: none Intrapartum Procedures: cesarean: low cervical, transverse Postpartum Procedures: none Complications-Operative and Postpartum: none Hemoglobin  Date Value Ref Range Status  08/05/2017 8.5 (L) 12.0 - 15.0 g/dL Final   HCT  Date Value Ref Range Status  08/05/2017 24.8 (L) 36.0 - 46.0 % Final    Physical Exam:  General: alert, cooperative and no distress Lochia: appropriate Uterine Fundus: firm Incision: healing well DVT Evaluation: No evidence of DVT seen on physical exam.  Discharge Diagnoses: Term Pregnancy-delivered  Discharge Information: Date: 08/07/2017 Activity: pelvic rest Diet: routine Medications: PNV, Ibuprofen and oxycodone, ferrous sulfate Condition: stable Instructions: refer to practice specific booklet Discharge to: home   Newborn Data: Live born female  Birth Weight: 7 lb 2.6 oz (3250 g) APGAR: 8, 9  Newborn Delivery   Birth date/time:  08/04/2017 13:44:00 Delivery type:  C-Section, Low Transverse C-section categorization:  Primary     Home with mother.  Roselle LocusJames E Catherine Cubero II 08/07/2017, 8:25 AM

## 2017-08-16 ENCOUNTER — Encounter (HOSPITAL_COMMUNITY)
Admission: RE | Admit: 2017-08-16 | Discharge: 2017-08-16 | Disposition: A | Discharge status: Home / Self Care | Payer: BLUE CROSS/BLUE SHIELD | Source: Ambulatory Visit

## 2017-08-16 HISTORY — DX: Unspecified asthma, uncomplicated: J45.909

## 2017-08-16 HISTORY — DX: Hypothyroidism, unspecified: E03.9

## 2017-08-16 HISTORY — DX: Headache: R51

## 2017-08-16 HISTORY — DX: Anxiety disorder, unspecified: F41.9

## 2017-08-16 HISTORY — DX: Headache, unspecified: R51.9

## 2017-11-19 ENCOUNTER — Emergency Department (HOSPITAL_COMMUNITY): Payer: BLUE CROSS/BLUE SHIELD

## 2017-11-19 ENCOUNTER — Encounter (HOSPITAL_COMMUNITY): Payer: Self-pay | Admitting: Emergency Medicine

## 2017-11-19 ENCOUNTER — Emergency Department (HOSPITAL_COMMUNITY)
Admission: EM | Admit: 2017-11-19 | Discharge: 2017-11-19 | Disposition: A | Discharge status: Home / Self Care | Payer: BLUE CROSS/BLUE SHIELD | Attending: Emergency Medicine | Admitting: Emergency Medicine

## 2017-11-19 DIAGNOSIS — E039 Hypothyroidism, unspecified: Secondary | ICD-10-CM | POA: Diagnosis not present

## 2017-11-19 DIAGNOSIS — R9431 Abnormal electrocardiogram [ECG] [EKG]: Secondary | ICD-10-CM

## 2017-11-19 DIAGNOSIS — I10 Essential (primary) hypertension: Secondary | ICD-10-CM | POA: Diagnosis not present

## 2017-11-19 DIAGNOSIS — Z79899 Other long term (current) drug therapy: Secondary | ICD-10-CM | POA: Diagnosis not present

## 2017-11-19 DIAGNOSIS — J45909 Unspecified asthma, uncomplicated: Secondary | ICD-10-CM | POA: Insufficient documentation

## 2017-11-19 DIAGNOSIS — R072 Precordial pain: Secondary | ICD-10-CM | POA: Diagnosis not present

## 2017-11-19 DIAGNOSIS — R079 Chest pain, unspecified: Secondary | ICD-10-CM | POA: Diagnosis present

## 2017-11-19 LAB — I-STAT BETA HCG BLOOD, ED (MC, WL, AP ONLY)

## 2017-11-19 LAB — CBC
HEMATOCRIT: 40.6 % (ref 36.0–46.0)
Hemoglobin: 12.8 g/dL (ref 12.0–15.0)
MCH: 26.3 pg (ref 26.0–34.0)
MCHC: 31.5 g/dL (ref 30.0–36.0)
MCV: 83.5 fL (ref 78.0–100.0)
Platelets: 221 10*3/uL (ref 150–400)
RBC: 4.86 MIL/uL (ref 3.87–5.11)
RDW: 15.3 % (ref 11.5–15.5)
WBC: 7.1 10*3/uL (ref 4.0–10.5)

## 2017-11-19 LAB — I-STAT TROPONIN, ED: Troponin i, poc: 0 ng/mL (ref 0.00–0.08)

## 2017-11-19 LAB — BASIC METABOLIC PANEL
Anion gap: 9 (ref 5–15)
BUN: 19 mg/dL (ref 6–20)
CHLORIDE: 101 mmol/L (ref 101–111)
CO2: 27 mmol/L (ref 22–32)
CREATININE: 0.9 mg/dL (ref 0.44–1.00)
Calcium: 9.3 mg/dL (ref 8.9–10.3)
GFR calc Af Amer: >60 mL/min (ref >60)
GFR calc non Af Amer: >60 mL/min (ref >60)
Glucose, Bld: 115 mg/dL — ABNORMAL HIGH (ref 65–99)
Potassium: 3.7 mmol/L (ref 3.5–5.1)
SODIUM: 137 mmol/L (ref 135–145)

## 2017-11-19 LAB — D-DIMER, QUANTITATIVE: D-Dimer, Quant: 0.48 ug/mL-FEU (ref 0.00–0.50)

## 2017-11-19 MED ORDER — NAPROXEN 375 MG PO TABS: 375.0000 mg | ORAL_TABLET | Freq: Two times a day (BID) | ORAL | 0 refills | Status: DC

## 2017-11-19 MED ORDER — KETOROLAC TROMETHAMINE 30 MG/ML IJ SOLN
30.0000 mg | Freq: Once | INTRAMUSCULAR | Status: AC
  Administered 2017-11-19: 30 mg via INTRAMUSCULAR
  Filled 2017-11-19: qty 1

## 2017-11-19 MED ORDER — METHOCARBAMOL 500 MG PO TABS: 500.0000 mg | ORAL_TABLET | Freq: Two times a day (BID) | ORAL | 0 refills | Status: DC

## 2017-11-19 MED ORDER — METHOCARBAMOL 500 MG PO TABS: 500.0000 mg | ORAL_TABLET | Freq: Once | ORAL | Status: DC

## 2017-11-19 NOTE — ED Triage Notes (Signed)
Pt reports sudden onset of left sided chest pain that began this morning while she was working. Pt states she has been lifting some heavy items this morning and pain is worse when she moves her left arm. Nad.

## 2017-11-19 NOTE — ED Provider Notes (Signed)
[facility]   CSN: [id number] Arrival date & time: 11/19/17  1057     History   Chief Complaint Chief Complaint  Patient presents with  . Chest Pain    HPI Desiree Singh is a 37 y.o. female.  HPI  Patient is a 37 year old female with a history of preeclampsia, currently on HTCZ for persistently elevated BP, presenting for left-sided chest pain.  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  Patient reports that she was lifting a box of rice crispies patient reports that any palpation was approximately 1 pound, when she felt sudden and severe cramping in the left chest.  The left chest since then has produced exquisite pain, particularly with palpation.  Patient reports she has pain with deep inspiration is made her more short of breath.  Patient denies any palpitations, lower extremity edema or calf pain.  Patient denies any family history of early MI.  Patient denies any estrogen use, recent immobilization, hospitalization, surgery, history of DVT/PE, family history DVT/PE, cancer treatment, or hemoptysis.  Patient denies any recent illnesses.  Patient did have pregnancy complicated by preeclampsia and gave birth 4 months ago.   Past Medical History:  Diagnosis Date  . Anxiety   . Asthma   . Headache   . Hypertension   . Hypothyroidism    2009-2012    Patient Active Problem List   Diagnosis Date Noted  . Breech presentation 08/04/2017  . Headache in pregnancy, antepartum, third trimester 07/07/2017  . Hypertension affecting pregnancy 07/07/2017    Past Surgical History:  Procedure Laterality Date  . CESAREAN SECTION N/A 08/04/2017   Procedure: CESAREAN SECTION;  Surgeon: Harold Hedgeomblin, James, MD;  [facility];  Service: Obstetrics;  Laterality: N/A;  . TYMPANOSTOMY TUBE PLACEMENT       OB History    Gravida    3   Para  2   Term  2   Preterm      AB  1   Living  2     SAB  1   TAB      Ectopic      Multiple  0   Live Births  2            Home Medications    Prior to Admission medications   Medication Sig Start Date End Date Taking? Authorizing Provider  acetaminophen (TYLENOL) 500 MG tablet Take 1,000 mg every 6 (six) hours as needed by mouth for mild pain or headache.    [provider]  ferrous sulfate 325 (65 FE) MG tablet Take 1 tablet (325 mg total) by mouth daily with breakfast. 08/07/17   Harold Hedgeomblin, James, MD  ibuprofen (ADVIL,MOTRIN) 600 MG tablet Take 1 tablet (600 mg total) by mouth every 6 (six) hours as needed for mild pain. 08/07/17   Harold Hedgeomblin, James, MD  oxyCODONE (ROXICODONE) 5 MG immediate release tablet Take 1 tablet (5 mg total) by mouth every 6 (six) hours as needed for severe pain. 08/07/17   Harold Hedgeomblin, James, MD    Family History Family History  Problem Relation Age of Onset  . Hypertension Mother   . Hypertension Maternal Aunt   . Diabetes Maternal Aunt   . Hypertension Maternal Grandmother   . Colon cancer Maternal Grandfather     Social History Social History   Tobacco Use  . Smoking status:  Never Smoker  . Smokeless tobacco: Never Used  Substance Use Topics  . Alcohol use: No  . Drug use: No     Allergies   Patient has no known allergies.   Review of Systems Review of Systems  Constitutional: Negative for chills and fever.  HENT: Negative for congestion and rhinorrhea.   Eyes: Negative for visual disturbance.  Respiratory: Positive for chest tightness and shortness of breath. Negative for wheezing.   Cardiovascular: Positive for chest pain. Negative for palpitations and leg swelling.  Gastrointestinal: Negative for abdominal pain, nausea and vomiting.  Genitourinary: Negative for difficulty urinating.  Musculoskeletal: Negative for arthralgias and myalgias.  Skin: Negative for rash.  Neurological: Negative for dizziness,  syncope, weakness and light-headedness.     Physical Exam Updated Vital Signs BP 134/79   Pulse 92   Temp 98.2 F (36.8 C) (Oral)   Resp 20   LMP 11/14/2017 (Exact Date)   SpO2 98%   Physical Exam  Constitutional: She appears well-developed and well-nourished. No distress.  HENT:  Head: Normocephalic and atraumatic.  Mouth/Throat: Oropharynx is clear and moist.  Eyes: Pupils are equal, round, and reactive to light. Conjunctivae and EOM are normal.  Neck: Normal range of motion. Neck supple.  Cardiovascular: Normal rate, regular rhythm, S1 normal and S2 normal.  No murmur heard. Pulses:      Radial pulses are 2+ on the right side, and 2+ on the left side.       Dorsalis pedis pulses are 2+ on the right side, and 2+ on the left side.  Pulmonary/Chest: Effort normal and breath sounds normal. She has no wheezes. She has no rales.  Abdominal: Soft. She exhibits no distension. There is no tenderness. There is no guarding.  Musculoskeletal: Normal range of motion. She exhibits no edema or deformity.  Tenderness to palpation of the left anterior thorax as patient can barely tolerate light touch.   Lymphadenopathy:    She has no cervical adenopathy.  Neurological: She is alert.  Cranial nerves grossly intact. Patient moves extremities symmetrically and with good coordination.  Skin: Skin is warm and dry. No rash noted. No erythema.  Psychiatric: She has a normal mood and affect. Her behavior is normal. Judgment and thought content normal.  Nursing note and vitals reviewed.    ED Treatments / Results  Labs (all labs ordered are listed, but only abnormal results are displayed) Labs Reviewed  CBC  BASIC METABOLIC PANEL  I-STAT TROPONIN, ED  I-STAT BETA HCG BLOOD, ED ([facility], [facility], [facility] ONLY)    EKG EKG Interpretation  Date/Time:  Sunday November 19 2017 11:09:44 EDT Ventricular Rate:  97 PR Interval:  134 QRS Duration: 98 QT Interval:  378 QTC Calculation: 480 R Axis:   53 Text  Interpretation:  Normal sinus rhythm with sinus arrhythmia Prolonged QT No previous tracing Confirmed by Gwyneth Sprout ([id number]) on 11/19/2017 12:13:11 PM   Radiology Dg Chest 2 View  Result Date: 11/19/2017 CLINICAL DATA:  Acute onset left chest pain that began this morning. EXAM: CHEST - 2 VIEW COMPARISON:  None. FINDINGS: The lungs are clear. Heart size is normal. There is no pneumothorax or pleural fluid. No acute bony abnormality. IMPRESSION: Normal chest. Electronically Signed   By: Drusilla Kanner M.D.   On: 11/19/2017 11:23    Procedures Procedures (including critical care time)  Medications Ordered in ED Medications - No data to display   Initial Impression / Assessment and Plan / ED Course  I have reviewed  the triage vital signs and the nursing notes.  Pertinent labs & imaging results that were available during my care of the patient were reviewed by me and considered in my medical decision making (see chart for details).     Differential diagnosis includes ACS, PE, thoracic aortic dissection, cardiac tamponade, pneumothorax, cholecystitis, esophageal spasm, gastroesophageal reflux, herpes zoster of the thorax, pericarditis, pneumonia, chest wall pain, costochondritis.   Doubt ACS, as troponin negative, EKG show no signs of ischemia, infarction, or arrhythmia, and HEART score 0.  Doubt PE as D-dimer negative and patient not tachycardic, tachypneic, or hypoxic. Doubt TAD by hx, CXR showed no widening mediastinum, and pulses equal in all extremities. Patient remained nontoxic appearing and in no acute distress during emergency department course. Vital signs stable in the emergency department. Pericarditis less likely due to no preceding infectious symptoms and pain not improved in upright positions.  Patient had improvement of pain with Toradol.  Patient did have prolonged QT slightly on EKG, and is not on any QT prolonging medications.  I discussed follow-up for repeat with  primary care provider.  Patient can return precautions for any worsening chest pain, shortness of breath, syncope or presyncope.  Patient is in understanding and agrees with the plan of care.  Final Clinical Impressions(s) / ED Diagnoses   Final diagnoses:  Precordial pain  Prolonged Q-T interval on ECG    ED Discharge Orders        Ordered    naproxen (NAPROSYN) 375 MG tablet  2 times daily     11/19/17 1408    methocarbamol (ROBAXIN) 500 MG tablet  2 times daily     11/19/17 [address] 11/19/17 1705    Gwyneth Sprout, MD 11/20/17 2257

## 2017-11-19 NOTE — Discharge Instructions (Addendum)
Please see the information and instructions below regarding your visit.  Your diagnoses today include:  1. Precordial pain   2. Prolonged Q-T interval on ECG    Your work-up is very reassuring today.  Your heart enzyme as well as the marker of blood clotting in the body are negative today.  Your pain may be due to muscular spasm, so we will treat this with nonsteroidal medications as well as muscle relaxants.  As we discussed, one interval in your EKG is prolonged.  Please have a repeat EKG in 1 week by your primary doctor to ensure that this is not persistent.  Tests performed today include: See side panel of your discharge paperwork for testing performed today. Vital signs are listed at the bottom of these instructions.   Medications prescribed:    Take any prescribed medications only as prescribed, and any over the counter medications only as directed on the packaging.  You are prescribed Robaxin, a muscle relaxant. Some common side effects of this medication include:  Feeling sleepy.  Dizziness. Take care upon going from a seated to a standing position.  Dry mouth.  Feeling tired or weak.  Hard stools (constipation).  Upset stomach. These are not all of the side effects that may occur. If you have questions about side effects, call your doctor. Call your primary care provider for medical advice about side effects.  This medication can be sedating. Only take this medication as needed. Please do not combine with alcohol. Do not drive or operate machinery while taking this medication.   This medication can interact with some other medications. Make sure to tell any provider you are taking this medication before they prescribe you a new medication.    Home care instructions:  Please follow any educational materials contained in this packet.   Follow-up instructions: Please follow-up with your primary care provider in  for further evaluation of your symptoms if they are not  completely improved.   Return instructions:  Please return to the Emergency Department if you experience worsening symptoms.  Please return to the emergency department for any severe chest pain, shortness of breath, pain that causes you to be sweaty, or if the episode of near passing out. Please return if you have any other emergent concerns.  Additional Information:   Your vital signs today were: BP (!) 116/53    Pulse (!) 58    Temp 98.2 F (36.8 C) (Oral)    Resp 17    LMP 11/14/2017 (Exact Date)    SpO2 96%  If your blood pressure (BP) was elevated on multiple readings during this visit above 130 for the top number or above 80 for the bottom number, please have this repeated by your primary care provider within one month. --------------  Thank you for allowing us to participate in your care today.

## 2018-06-29 ENCOUNTER — Encounter: Payer: BLUE CROSS/BLUE SHIELD | Admitting: Family Medicine

## 2019-11-11 ENCOUNTER — Emergency Department (HOSPITAL_BASED_OUTPATIENT_CLINIC_OR_DEPARTMENT_OTHER): Payer: Commercial Managed Care - PPO

## 2019-11-11 ENCOUNTER — Emergency Department (HOSPITAL_BASED_OUTPATIENT_CLINIC_OR_DEPARTMENT_OTHER)
Admission: EM | Admit: 2019-11-11 | Discharge: 2019-11-11 | Disposition: A | Discharge status: Home / Self Care | Payer: Commercial Managed Care - PPO | Attending: Emergency Medicine | Admitting: Emergency Medicine

## 2019-11-11 ENCOUNTER — Encounter (HOSPITAL_BASED_OUTPATIENT_CLINIC_OR_DEPARTMENT_OTHER): Payer: Self-pay | Admitting: *Deleted

## 2019-11-11 ENCOUNTER — Other Ambulatory Visit: Payer: Self-pay

## 2019-11-11 ENCOUNTER — Emergency Department (HOSPITAL_COMMUNITY): Payer: Commercial Managed Care - PPO

## 2019-11-11 DIAGNOSIS — E039 Hypothyroidism, unspecified: Secondary | ICD-10-CM | POA: Insufficient documentation

## 2019-11-11 DIAGNOSIS — R109 Unspecified abdominal pain: Secondary | ICD-10-CM | POA: Diagnosis not present

## 2019-11-11 DIAGNOSIS — I10 Essential (primary) hypertension: Secondary | ICD-10-CM | POA: Diagnosis not present

## 2019-11-11 DIAGNOSIS — Z79899 Other long term (current) drug therapy: Secondary | ICD-10-CM | POA: Diagnosis not present

## 2019-11-11 DIAGNOSIS — Z7984 Long term (current) use of oral hypoglycemic drugs: Secondary | ICD-10-CM | POA: Diagnosis not present

## 2019-11-11 HISTORY — DX: Personal history of other diseases of the female genital tract: Z87.42

## 2019-11-11 LAB — CBC WITH DIFFERENTIAL/PLATELET
Abs Immature Granulocytes: 0.02 10*3/uL (ref 0.00–0.07)
Basophils Absolute: 0.1 10*3/uL (ref 0.0–0.1)
Basophils Relative: 1 %
Eosinophils Absolute: 0.2 10*3/uL (ref 0.0–0.5)
Eosinophils Relative: 2 %
HCT: 40.3 % (ref 36.0–46.0)
Hemoglobin: 13.1 g/dL (ref 12.0–15.0)
Immature Granulocytes: 0 %
Lymphocytes Relative: 31 %
Lymphs Abs: 2.9 10*3/uL (ref 0.7–4.0)
MCH: 28.1 pg (ref 26.0–34.0)
MCHC: 32.5 g/dL (ref 30.0–36.0)
MCV: 86.5 fL (ref 80.0–100.0)
Monocytes Absolute: 0.8 10*3/uL (ref 0.1–1.0)
Monocytes Relative: 8 %
Neutro Abs: 5.6 10*3/uL (ref 1.7–7.7)
Neutrophils Relative %: 58 %
Platelets: 227 10*3/uL (ref 150–400)
RBC: 4.66 MIL/uL (ref 3.87–5.11)
RDW: 13.3 % (ref 11.5–15.5)
WBC: 9.6 10*3/uL (ref 4.0–10.5)
nRBC: 0 % (ref 0.0–0.2)

## 2019-11-11 LAB — BASIC METABOLIC PANEL
Anion gap: 9 (ref 5–15)
BUN: 14 mg/dL (ref 6–20)
CO2: 22 mmol/L (ref 22–32)
Calcium: 9 mg/dL (ref 8.9–10.3)
Chloride: 107 mmol/L (ref 98–111)
Creatinine, Ser: 0.77 mg/dL (ref 0.44–1.00)
GFR calc Af Amer: >60 mL/min (ref >60)
GFR calc non Af Amer: >60 mL/min (ref >60)
Glucose, Bld: 122 mg/dL — ABNORMAL HIGH (ref 70–99)
Potassium: 4.6 mmol/L (ref 3.5–5.1)
Sodium: 138 mmol/L (ref 135–145)

## 2019-11-11 LAB — URINALYSIS, ROUTINE W REFLEX MICROSCOPIC
Bilirubin Urine: NEGATIVE
Glucose, UA: NEGATIVE mg/dL
Ketones, ur: NEGATIVE mg/dL
Leukocytes,Ua: NEGATIVE
Nitrite: NEGATIVE
Protein, ur: NEGATIVE mg/dL
Specific Gravity, Urine: >1.03 — ABNORMAL HIGH (ref 1.005–1.030)
pH: 5.5 (ref 5.0–8.0)

## 2019-11-11 LAB — HEPATIC FUNCTION PANEL
ALT: 24 U/L (ref 0–44)
AST: 25 U/L (ref 15–41)
Albumin: 4 g/dL (ref 3.5–5.0)
Alkaline Phosphatase: 57 U/L (ref 38–126)
Bilirubin, Direct: 0.2 mg/dL (ref 0.0–0.2)
Indirect Bilirubin: 0.4 mg/dL (ref 0.3–0.9)
Total Bilirubin: 0.6 mg/dL (ref 0.3–1.2)
Total Protein: 7.6 g/dL (ref 6.5–8.1)

## 2019-11-11 LAB — HCG, QUANTITATIVE, PREGNANCY: hCG, Beta Chain, Quant, S: <1 m[IU]/mL (ref <5)

## 2019-11-11 LAB — URINALYSIS, MICROSCOPIC (REFLEX)

## 2019-11-11 LAB — WET PREP, GENITAL
Clue Cells Wet Prep HPF POC: NONE SEEN
Sperm: NONE SEEN
Trich, Wet Prep: NONE SEEN
Yeast Wet Prep HPF POC: NONE SEEN

## 2019-11-11 LAB — LIPASE, BLOOD: Lipase: 29 U/L (ref 11–51)

## 2019-11-11 MED ORDER — OXYCODONE-ACETAMINOPHEN 5-325 MG PO TABS
1.0000 | ORAL_TABLET | Freq: Once | ORAL | Status: AC
  Administered 2019-11-11: 1 via ORAL
  Filled 2019-11-11: qty 1

## 2019-11-11 MED ORDER — SODIUM CHLORIDE 0.9 % IV BOLUS
1000.0000 mL | Freq: Once | INTRAVENOUS | Status: AC
  Administered 2019-11-11: 1000 mL via INTRAVENOUS

## 2019-11-11 MED ORDER — KETOROLAC TROMETHAMINE 15 MG/ML IJ SOLN
15.0000 mg | Freq: Once | INTRAMUSCULAR | Status: AC
  Administered 2019-11-11: 15 mg via INTRAVENOUS
  Filled 2019-11-11: qty 1

## 2019-11-11 MED ORDER — MORPHINE SULFATE (PF) 4 MG/ML IV SOLN
4.0000 mg | Freq: Once | INTRAVENOUS | Status: AC
  Administered 2019-11-11: 4 mg via INTRAVENOUS
  Filled 2019-11-11: qty 1

## 2019-11-11 MED ORDER — ETODOLAC 300 MG PO CAPS
300.0000 mg | ORAL_CAPSULE | Freq: Three times a day (TID) | ORAL | 0 refills | Status: DC
Start: 2019-11-11 — End: 2020-12-16

## 2019-11-11 MED ORDER — HYDROCODONE-ACETAMINOPHEN 5-325 MG PO TABS: 1.0000 | ORAL_TABLET | Freq: Four times a day (QID) | ORAL | 0 refills | Status: DC | PRN

## 2019-11-11 MED ORDER — OMEPRAZOLE 20 MG PO CPDR
20.0000 mg | DELAYED_RELEASE_CAPSULE | Freq: Every day | ORAL | 0 refills | Status: DC
Start: 2019-11-11 — End: 2020-12-16

## 2019-11-11 MED ORDER — ONDANSETRON HCL 4 MG/2ML IJ SOLN
4.0000 mg | Freq: Once | INTRAMUSCULAR | Status: AC
  Administered 2019-11-11: 4 mg via INTRAVENOUS
  Filled 2019-11-11: qty 2

## 2019-11-11 MED ORDER — CYCLOBENZAPRINE HCL 10 MG PO TABS
10.0000 mg | ORAL_TABLET | Freq: Two times a day (BID) | ORAL | 0 refills | Status: DC | PRN
Start: 2019-11-11 — End: 2020-12-16

## 2019-11-11 NOTE — ED Provider Notes (Signed)
Patient was seen at [facility] this morning for right flank pain.  Please see Dr. Winnifred Friar initial note.  Patient had laboratory tests pelvic exam and a CT scan.  Patient was sent to the ED to have a right upper quadrant ultrasound.  Ultrasound does not show any signs of gallstones or cholecystitis.  Etiology of the patient's pain is unclear however she does not have findings to suggest appendicitis, ureterolithiasis, cholecystitis, obstruction or other emergent etiology.  Is possible the pain could be musculoskeletal.  Biliary dyskinesia is also a possibility.  Discussed outpatient follow-up with her primary care doctor to discuss further evaluation.   Linwood Dibbles, MD 11/11/19 [phone]

## 2019-11-11 NOTE — ED Notes (Signed)
Pt's husband is transporting her to the [place] ED

## 2019-11-11 NOTE — ED Notes (Signed)
Waiting for u/s to be scheduled prior to d/c

## 2019-11-11 NOTE — ED Notes (Signed)
Pt will now be transferred to [facility] ED for u/s.

## 2019-11-11 NOTE — ED Notes (Signed)
Lynelle Doctor, EDP made aware patient is asking for pain meds.

## 2019-11-11 NOTE — ED Notes (Signed)
To CT scan

## 2019-11-11 NOTE — ED Provider Notes (Signed)
[facility]ERGENCY DEPARTMENT Provider Note   CSN: [id number] Arrival date & time: 11/11/19  0207     History Chief Complaint  Patient presents with  . right flank pain    Desiree Singh is a 39 y.o. female.  HPI     This is a 39 year old female with a history of PCOS, hypertension, kidney stones who presents with right-sided flank and abdominal pain.  Patient reports onset of pain around 9 PM but acute worsening at 1 AM.  She reports that it is sharp and stabbing.  It is largely nonradiating.  She reports nausea without vomiting.  She has not taken anything for pain.  No diarrhea or constipation.  Denies hematuria or dysuria.  States that it is worse than any pain she has ever had before.  She rates her pain at 9 out of 10.  Past Medical History:  Diagnosis Date  . Anxiety   . Asthma   . Headache   . History of PCOS   . Hypertension   . Hypothyroidism    2009-2012    Patient Active Problem List   Diagnosis Date Noted  . Breech presentation 08/04/2017  . Headache in pregnancy, antepartum, third trimester 07/07/2017  . Hypertension affecting pregnancy 07/07/2017    Past Surgical History:  Procedure Laterality Date  . CESAREAN SECTION N/A 08/04/2017   Procedure: CESAREAN SECTION;  Surgeon: Everlene Farrier, MD;  Location: [place];  Service: Obstetrics;  Laterality: N/A;  . TYMPANOSTOMY TUBE PLACEMENT       OB History    Gravida  3   Para  2   Term  2   Preterm      AB  1   Living  2     SAB  1   TAB      Ectopic      Multiple  0   Live Births  2           Family History  Problem Relation Age of Onset  . Hypertension Mother   . Hypertension Maternal Aunt   . Diabetes Maternal Aunt   . Hypertension Maternal Grandmother   . Colon cancer Maternal Grandfather     Social History   Tobacco Use  . Smoking status: Never Smoker  . Smokeless tobacco: Never Used  Substance Use Topics  . Alcohol use: No  . Drug use: No     Home Medications Prior to Admission medications   Medication Sig Start Date End Date Taking? Authorizing Provider  metFORMIN (GLUCOPHAGE) 1000 MG tablet Take 1,000 mg by mouth 2 (two) times daily with a meal.   Yes [provider]  acetaminophen (TYLENOL) 500 MG tablet Take 1,000 mg every 6 (six) hours as needed by mouth for mild pain or headache.    [provider]  ferrous sulfate 325 (65 FE) MG tablet Take 1 tablet (325 mg total) by mouth daily with breakfast. Patient not taking: Reported on 11/19/2017 08/07/17   Everlene Farrier, MD  hydrochlorothiazide (HYDRODIURIL) 12.5 MG tablet Take 12.5 mg by mouth every morning. 10/11/17   [provider]  HYDROcodone-acetaminophen (NORCO/VICODIN) 5-325 MG tablet Take 1 tablet by mouth every 6 (six) hours as needed. 11/11/19   Shavawn Stobaugh, Barbette Hair, MD  ibuprofen (ADVIL,MOTRIN) 200 MG tablet Take 800 mg by mouth every 8 (eight) hours as needed for moderate pain.    [provider]  ibuprofen (ADVIL,MOTRIN) 600 MG tablet Take 1 tablet (600 mg total) by mouth every 6 (  six) hours as needed for mild pain. 08/07/17   Harold Hedge, MD  methocarbamol (ROBAXIN) 500 MG tablet Take 1 tablet (500 mg total) by mouth 2 (two) times daily. 11/19/17   Aviva Kluver B, PA-C  Multiple Vitamin (MULTIVITAMIN WITH MINERALS) TABS tablet Take 1 tablet by mouth daily.    [provider]  naproxen (NAPROSYN) 375 MG tablet Take 1 tablet (375 mg total) by mouth 2 (two) times daily. 11/19/17   Aviva Kluver B, PA-C  oxyCODONE (ROXICODONE) 5 MG immediate release tablet Take 1 tablet (5 mg total) by mouth every 6 (six) hours as needed for severe pain. 08/07/17   Harold Hedge, MD    Allergies    Patient has no known allergies.  Review of Systems   Review of Systems  Constitutional: Negative for fever.  Respiratory: Negative for shortness of breath.   Cardiovascular: Negative for chest pain.  Gastrointestinal: Positive for abdominal pain and  nausea. Negative for vomiting.  Genitourinary: Positive for flank pain. Negative for difficulty urinating, dysuria and hematuria.  All other systems reviewed and are negative.   Physical Exam Updated Vital Signs BP (!) 140/99 (BP Location: Left Arm)   Pulse 81   Temp 98.3 F (36.8 C) (Oral)   Resp 18   Ht 1.753 m (5\' 9" )   Wt 120.2 kg   LMP 10/17/2019   SpO2 97%   BMI 39.13 kg/m   Physical Exam Vitals and nursing note reviewed.  Constitutional:      Appearance: She is well-developed. She is obese.     Comments: Uncomfortable appearing but nontoxic  HENT:     Head: Normocephalic and atraumatic.     Nose: Nose normal.     Mouth/Throat:     Mouth: Mucous membranes are moist.  Eyes:     Pupils: Pupils are equal, round, and reactive to light.  Cardiovascular:     Rate and Rhythm: Normal rate and regular rhythm.     Heart sounds: Normal heart sounds.  Pulmonary:     Effort: Pulmonary effort is normal. No respiratory distress.     Breath sounds: No wheezing.  Abdominal:     General: Bowel sounds are normal.     Palpations: Abdomen is soft.     Tenderness: There is abdominal tenderness. There is no right CVA tenderness, left CVA tenderness, guarding or rebound.     Comments: Right upper quadrant/right flank tenderness palpation  Genitourinary:    Comments: No cervical motion tenderness, scant discharge, no adnexal mass or tenderness Musculoskeletal:     Cervical back: Neck supple.     Right lower leg: No edema.     Left lower leg: No edema.  Skin:    General: Skin is warm and dry.     Findings: No rash.  Neurological:     Mental Status: She is alert and oriented to person, place, and time.  Psychiatric:        Mood and Affect: Mood normal.     ED Results / Procedures / Treatments   Labs (all labs ordered are listed, but only abnormal results are displayed) Labs Reviewed  WET PREP, GENITAL - Abnormal; Notable for the following components:      Result Value    WBC, Wet Prep HPF POC MODERATE (*)    All other components within normal limits  URINALYSIS, ROUTINE W REFLEX MICROSCOPIC - Abnormal; Notable for the following components:   APPearance HAZY (*)    Specific Gravity, Urine >1.030 (*)  Hgb urine dipstick SMALL (*)    All other components within normal limits  BASIC METABOLIC PANEL - Abnormal; Notable for the following components:   Glucose, Bld 122 (*)    All other components within normal limits  URINALYSIS, MICROSCOPIC (REFLEX) - Abnormal; Notable for the following components:   Bacteria, UA MANY (*)    All other components within normal limits  URINE CULTURE  CBC WITH DIFFERENTIAL/PLATELET  HCG, QUANTITATIVE, PREGNANCY  LIPASE, BLOOD  HEPATIC FUNCTION PANEL  GC/CHLAMYDIA PROBE AMP ([place]) NOT AT [facility]    EKG None  Radiology CT Renal Stone Study  Result Date: 11/11/2019 CLINICAL DATA:  Worsening right flank pain with stabbing quality EXAM: CT ABDOMEN AND PELVIS WITHOUT CONTRAST TECHNIQUE: Multidetector CT imaging of the abdomen and pelvis was performed following the standard protocol without IV contrast. COMPARISON:  CT 09/20/2013 FINDINGS: Lower chest: Atelectatic changes in the right lung base. No focal consolidative opacity or pleural effusion. Normal heart size. No pericardial effusion. Hepatobiliary: Smooth liver surface contour. Normal hepatic attenuation. No visible liver lesions on this noncontrast CT. Normal gallbladder. Normal caliber biliary tree. No visible calcified gallstones. Pancreas: Unremarkable. No pancreatic ductal dilatation or surrounding inflammatory changes. Spleen: Normal in size without focal abnormality. Adrenals/Urinary Tract: Normal adrenal glands. Kidneys are symmetric in size and normally located. No visible or contour deforming renal lesions. No urolithiasis or hydronephrosis. Urinary bladder is largely decompressed at the time of exam and therefore poorly evaluated by CT imaging. No gross bladder  abnormality or visible bladder calculi. Stomach/Bowel: Distal esophagus, stomach and duodenal sweep are unremarkable. No small bowel wall thickening or dilatation. Extensive fecalization of the distal small bowel contents may reflect slowed intestinal transit without evidence of mechanical obstruction. A normal appendix is visualized. Moderate colonic stool burden. No colonic dilatation or wall thickening. Vascular/Lymphatic: No significant vascular findings are present. No enlarged abdominal or pelvic lymph nodes. Reproductive: Slightly retroverted uterus. Normal appearance of the ovaries without concerning adnexal lesions. Other: No abdominopelvic free fluid or free gas. No bowel containing hernias. Musculoskeletal: No acute osseous abnormality or suspicious osseous lesion. Mild degenerative changes in the spine are similar to prior. Mild spurring about the bilateral hips as well. IMPRESSION: 1. No urolithiasis or hydronephrosis. 2. Extensive fecalization of the distal small bowel contents and moderate colonic stool burden may reflect slowed intestinal transit without evidence of mechanical obstruction. Correlate for clinical features of constipation. 3. Normal appendix in the right lower quadrant. Electronically Signed   By: Kreg Shropshire M.D.   On: 11/11/2019 03:46    Procedures Procedures (including critical care time)  Medications Ordered in ED Medications  sodium chloride 0.9 % bolus 1,000 mL ( Intravenous Stopped 11/11/19 0403)  morphine 4 MG/ML injection 4 mg (4 mg Intravenous Given 11/11/19 0241)  ondansetron (ZOFRAN) injection 4 mg (4 mg Intravenous Given 11/11/19 0240)  ketorolac (TORADOL) 15 MG/ML injection 15 mg (15 mg Intravenous Given 11/11/19 0349)  morphine 4 MG/ML injection 4 mg (4 mg Intravenous Given 11/11/19 0349)  oxyCODONE-acetaminophen (PERCOCET/ROXICET) 5-325 MG per tablet 1 tablet (1 tablet Oral Given 11/11/19 0503)    ED Course  I have reviewed the triage vital signs and the nursing  notes.  Pertinent labs & imaging results that were available during my care of the patient were reviewed by me and considered in my medical decision making (see chart for details).    MDM Rules/Calculators/A&P  Patient presents with fairly acute onset right flank and right upper quadrant pain.  She is overall nontoxic but uncomfortable appearing and vital signs are reassuring.  She is afebrile.  Considerations include but not limited to kidney stone, UTI, gallbladder pathology.  Patient given pain and nausea medication.  Lab work obtained.  No significant leukocytosis.  No metabolic derangements.  Normal renal function.  Patient most improved with Toradol.  CT noncontrasted is largely unremarkable without evidence of acute stone.  They comment on reassuring adnexa and no obvious gallstones.  Pelvic exam does not reveal any pelvic mass or lateralizing adnexal tenderness.  Feel ovarian pathology or torsion Is less likely.  Patient has had some recurrent colicky pain on the emergency department.  Given location of pain, gallbladder pathology is a consideration although no gallstones were seen on CT.  Initial plan was to order outpatient ultrasound for later today.  However, ultrasound booked until Tuesday.  Patient does not feel comfortable waiting this long.  Will transfer for ultrasound at [facility].  Discussed with Dr. Eudelia Bunch.  Ultrasound ordered.  Husband to transport to [facility] ED.  Final Clinical Impression(s) / ED Diagnoses Final diagnoses:  Right flank pain    Rx / DC Orders ED Discharge Orders         Ordered    HYDROcodone-acetaminophen (NORCO/VICODIN) 5-325 MG tablet  Every 6 hours PRN     11/11/19 0520    US Abdomen Limited RUQ/Gall Bladder     11/11/19 0520           Shon Baton, MD 11/11/19 [phone]

## 2019-11-11 NOTE — ED Triage Notes (Signed)
C/o right flank pain that started 9pm but worse at 1am. Describes as a stabbing type pain. C/o nausea. Denies vomiting. Hx of kidney stone. Has been urinating. Has not taken anything for pain.

## 2019-11-11 NOTE — ED Notes (Signed)
Elsie Ra, Charge RN aware that pt is being transferred to [facility] ED for an U/S. Report given

## 2019-11-11 NOTE — ED Notes (Signed)
Returned from CT. C/o of increase in abd pain. Rates 9/10. Dr. Wilkie Aye aware and orders received.

## 2019-11-11 NOTE — Discharge Instructions (Addendum)
You were seen today for flank and abdominal pain.  Your initial work-up is reassuring.  Your CT scan does not show any evidence of kidney stone or other pathology.  Your lab work-up is reassuring.  Return later today for right upper quadrant ultrasound to evaluate your gallbladder better.

## 2019-11-12 ENCOUNTER — Other Ambulatory Visit (HOSPITAL_BASED_OUTPATIENT_CLINIC_OR_DEPARTMENT_OTHER): Payer: Commercial Managed Care - PPO

## 2019-11-12 LAB — URINE CULTURE

## 2019-11-12 LAB — GC/CHLAMYDIA PROBE AMP (~~LOC~~) NOT AT ARMC
Chlamydia: NEGATIVE
Comment: NEGATIVE
Comment: NORMAL
Neisseria Gonorrhea: NEGATIVE

## 2019-12-16 ENCOUNTER — Encounter (HOSPITAL_BASED_OUTPATIENT_CLINIC_OR_DEPARTMENT_OTHER): Payer: Self-pay | Admitting: *Deleted

## 2019-12-16 ENCOUNTER — Other Ambulatory Visit: Payer: Self-pay

## 2019-12-16 ENCOUNTER — Emergency Department (HOSPITAL_BASED_OUTPATIENT_CLINIC_OR_DEPARTMENT_OTHER)
Admission: EM | Admit: 2019-12-16 | Discharge: 2019-12-16 | Disposition: A | Discharge status: Home / Self Care | Payer: Commercial Managed Care - PPO | Attending: Emergency Medicine | Admitting: Emergency Medicine

## 2019-12-16 DIAGNOSIS — R519 Headache, unspecified: Secondary | ICD-10-CM | POA: Insufficient documentation

## 2019-12-16 DIAGNOSIS — R1110 Vomiting, unspecified: Secondary | ICD-10-CM | POA: Insufficient documentation

## 2019-12-16 DIAGNOSIS — R509 Fever, unspecified: Secondary | ICD-10-CM | POA: Diagnosis not present

## 2019-12-16 DIAGNOSIS — Z5321 Procedure and treatment not carried out due to patient leaving prior to being seen by health care provider: Secondary | ICD-10-CM | POA: Insufficient documentation

## 2019-12-16 DIAGNOSIS — M791 Myalgia, unspecified site: Secondary | ICD-10-CM | POA: Diagnosis not present

## 2019-12-16 DIAGNOSIS — R109 Unspecified abdominal pain: Secondary | ICD-10-CM | POA: Diagnosis present

## 2019-12-16 DIAGNOSIS — R5383 Other fatigue: Secondary | ICD-10-CM | POA: Diagnosis not present

## 2019-12-16 DIAGNOSIS — R197 Diarrhea, unspecified: Secondary | ICD-10-CM | POA: Diagnosis not present

## 2019-12-16 LAB — COMPREHENSIVE METABOLIC PANEL
ALT: 30 U/L (ref 0–44)
AST: 18 U/L (ref 15–41)
Albumin: 3.9 g/dL (ref 3.5–5.0)
Alkaline Phosphatase: 55 U/L (ref 38–126)
Anion gap: 11 (ref 5–15)
BUN: 14 mg/dL (ref 6–20)
CO2: 22 mmol/L (ref 22–32)
Calcium: 8.4 mg/dL — ABNORMAL LOW (ref 8.9–10.3)
Chloride: 102 mmol/L (ref 98–111)
Creatinine, Ser: 0.69 mg/dL (ref 0.44–1.00)
GFR calc Af Amer: >60 mL/min (ref >60)
GFR calc non Af Amer: >60 mL/min (ref >60)
Glucose, Bld: 107 mg/dL — ABNORMAL HIGH (ref 70–99)
Potassium: 3.8 mmol/L (ref 3.5–5.1)
Sodium: 135 mmol/L (ref 135–145)
Total Bilirubin: 0.7 mg/dL (ref 0.3–1.2)
Total Protein: 7.1 g/dL (ref 6.5–8.1)

## 2019-12-16 LAB — CBC
HCT: 40 % (ref 36.0–46.0)
Hemoglobin: 13.3 g/dL (ref 12.0–15.0)
MCH: 28 pg (ref 26.0–34.0)
MCHC: 33.3 g/dL (ref 30.0–36.0)
MCV: 84.2 fL (ref 80.0–100.0)
Platelets: 178 10*3/uL (ref 150–400)
RBC: 4.75 MIL/uL (ref 3.87–5.11)
RDW: 13.7 % (ref 11.5–15.5)
WBC: 8.8 10*3/uL (ref 4.0–10.5)
nRBC: 0 % (ref 0.0–0.2)

## 2019-12-16 LAB — LIPASE, BLOOD: Lipase: 25 U/L (ref 11–51)

## 2019-12-16 MED ORDER — SODIUM CHLORIDE 0.9% FLUSH
3.0000 mL | Freq: Once | INTRAVENOUS | Status: DC
  Filled 2019-12-16: qty 3

## 2019-12-16 MED ORDER — ONDANSETRON 4 MG PO TBDP
4.0000 mg | ORAL_TABLET | Freq: Once | ORAL | Status: AC
  Administered 2019-12-16: 4 mg via ORAL
  Filled 2019-12-16: qty 1

## 2019-12-16 NOTE — ED Notes (Signed)
Pt reported to registration clerk that she was going to leave

## 2019-12-16 NOTE — ED Triage Notes (Signed)
Abdominal pain, diarrhea, vomiting, fever, body aches, headache, fatigue since yesterday. She has 2 other family members that just recovered from the same symptoms.

## 2020-01-15 ENCOUNTER — Other Ambulatory Visit: Payer: Self-pay | Admitting: Internal Medicine

## 2020-01-15 DIAGNOSIS — E049 Nontoxic goiter, unspecified: Secondary | ICD-10-CM

## 2020-01-20 ENCOUNTER — Ambulatory Visit
Admission: RE | Admit: 2020-01-20 | Discharge: 2020-01-20 | Disposition: A | Discharge status: Home / Self Care | Payer: Commercial Managed Care - PPO | Source: Ambulatory Visit | Attending: Internal Medicine | Admitting: Internal Medicine

## 2020-01-20 DIAGNOSIS — E049 Nontoxic goiter, unspecified: Secondary | ICD-10-CM

## 2020-03-05 ENCOUNTER — Inpatient Hospital Stay (HOSPITAL_COMMUNITY): Payer: Commercial Managed Care - PPO

## 2020-03-05 ENCOUNTER — Other Ambulatory Visit: Payer: Self-pay

## 2020-03-05 ENCOUNTER — Inpatient Hospital Stay (HOSPITAL_COMMUNITY)
Admission: AD | Admit: 2020-03-05 | Discharge: 2020-03-05 | Disposition: A | Discharge status: Home / Self Care | Payer: Commercial Managed Care - PPO | Attending: Obstetrics and Gynecology | Admitting: Obstetrics and Gynecology

## 2020-03-05 DIAGNOSIS — O039 Complete or unspecified spontaneous abortion without complication: Secondary | ICD-10-CM | POA: Diagnosis not present

## 2020-03-05 DIAGNOSIS — N979 Female infertility, unspecified: Secondary | ICD-10-CM

## 2020-03-05 DIAGNOSIS — Z7984 Long term (current) use of oral hypoglycemic drugs: Secondary | ICD-10-CM | POA: Insufficient documentation

## 2020-03-05 DIAGNOSIS — Z79899 Other long term (current) drug therapy: Secondary | ICD-10-CM | POA: Insufficient documentation

## 2020-03-05 DIAGNOSIS — O209 Hemorrhage in early pregnancy, unspecified: Secondary | ICD-10-CM

## 2020-03-05 DIAGNOSIS — O26891 Other specified pregnancy related conditions, first trimester: Secondary | ICD-10-CM | POA: Diagnosis present

## 2020-03-05 DIAGNOSIS — O3680X0 Pregnancy with inconclusive fetal viability, not applicable or unspecified: Secondary | ICD-10-CM

## 2020-03-05 LAB — CBC
HCT: 39.8 % (ref 36.0–46.0)
Hemoglobin: 12.9 g/dL (ref 12.0–15.0)
MCH: 27.5 pg (ref 26.0–34.0)
MCHC: 32.4 g/dL (ref 30.0–36.0)
MCV: 84.9 fL (ref 80.0–100.0)
Platelets: 186 10*3/uL (ref 150–400)
RBC: 4.69 MIL/uL (ref 3.87–5.11)
RDW: 14.1 % (ref 11.5–15.5)
WBC: 7.4 10*3/uL (ref 4.0–10.5)
nRBC: 0 % (ref 0.0–0.2)

## 2020-03-05 LAB — URINALYSIS, ROUTINE W REFLEX MICROSCOPIC
Bilirubin Urine: NEGATIVE
Glucose, UA: NEGATIVE mg/dL
Ketones, ur: NEGATIVE mg/dL
Leukocytes,Ua: NEGATIVE
Nitrite: NEGATIVE
Protein, ur: 100 mg/dL — AB
RBC / HPF: >50 RBC/hpf — ABNORMAL HIGH (ref 0–5)
Specific Gravity, Urine: 1.026 (ref 1.005–1.030)
pH: 5 (ref 5.0–8.0)

## 2020-03-05 LAB — WET PREP, GENITAL
Clue Cells Wet Prep HPF POC: NONE SEEN
Sperm: NONE SEEN
Trich, Wet Prep: NONE SEEN
Yeast Wet Prep HPF POC: NONE SEEN

## 2020-03-05 LAB — GC/CHLAMYDIA PROBE AMP (~~LOC~~) NOT AT ARMC
Chlamydia: NEGATIVE
Comment: NEGATIVE
Comment: NORMAL
Neisseria Gonorrhea: NEGATIVE

## 2020-03-05 LAB — POCT PREGNANCY, URINE: Preg Test, Ur: NEGATIVE

## 2020-03-05 LAB — HCG, QUANTITATIVE, PREGNANCY: hCG, Beta Chain, Quant, S: 5 m[IU]/mL — ABNORMAL HIGH (ref <5)

## 2020-03-05 NOTE — Discharge Instructions (Signed)

## 2020-03-05 NOTE — MAU Note (Signed)
Pt reports she is [redacted] weeks pregnant and had some brownish discharge yesterday but this am it is bright red bleeding . Cramping this am.

## 2020-03-05 NOTE — MAU Provider Note (Signed)
Chief Complaint: Possible Pregnancy, Vaginal Bleeding, and Abdominal Pain   First Provider Initiated Contact with Patient 03/05/20 0545        SUBJECTIVE HPI: Desiree Singh is a 39 y.o. G3P2012 at 5 weeks by LMP who presents to maternity admissions reporting bleeding which started yesterday and got heavier today.  Has some pelvic cramping as well.   Conceived by IUI.  Marland KitchenWas planning IVF next month. She denies vaginal itching/burning, urinary symptoms, h/a, dizziness, n/v, or fever/chills.     Possible Pregnancy This is a new problem. The current episode started in the past 7 days. Associated symptoms include abdominal pain. Pertinent negatives include no chills, fever, myalgias or visual change.  Vaginal Bleeding The patient's primary symptoms include pelvic pain and vaginal bleeding. The patient's pertinent negatives include no genital itching, genital lesions or genital odor. This is a new problem. The current episode started yesterday. The problem has been unchanged. The pain is mild. Associated symptoms include abdominal pain. Pertinent negatives include no chills, constipation, diarrhea, dysuria or fever. The vaginal discharge was bloody. The vaginal bleeding is lighter than menses. She has not been passing clots. She has not been passing tissue. Nothing aggravates the symptoms. She has tried nothing for the symptoms.  Abdominal Pain This is a new problem. The current episode started yesterday. The onset quality is gradual. The pain is located in the suprapubic region. The quality of the pain is cramping. The abdominal pain does not radiate. Pertinent negatives include no constipation, diarrhea, dysuria, fever or myalgias.   RN Note: Pt reports she is [redacted] weeks pregnant and had some brownish discharge yesterday but this am it is bright red bleeding . Cramping this am.   Past Medical History:  Diagnosis Date  . Anxiety   . Asthma   . Headache   . History of PCOS   . Hypertension   .  Hypothyroidism    2009-2012   Past Surgical History:  Procedure Laterality Date  . CESAREAN SECTION N/A 08/04/2017   Procedure: CESAREAN SECTION;  Surgeon: Harold Hedge, MD;  Location: [facility] BIRTHING SUITES;  Service: Obstetrics;  Laterality: N/A;  . TYMPANOSTOMY TUBE PLACEMENT     Social History   Socioeconomic History  . Marital status: Married    Spouse name: Not on file  . Number of children: Not on file  . Years of education: Not on file  . Highest education level: Not on file  Occupational History  . Not on file  Tobacco Use  . Smoking status: Never Smoker  . Smokeless tobacco: Never Used  Substance and Sexual Activity  . Alcohol use: No  . Drug use: No  . Sexual activity: Yes    Birth control/protection: None  Other Topics Concern  . Not on file  Social History Narrative  . Not on file   Social Determinants of Health   Financial Resource Strain:   . Difficulty of Paying Living Expenses: Not on file  Food Insecurity:   . Worried About Programme researcher, broadcasting/film/video in the Last Year: Not on file  . Ran Out of Food in the Last Year: Not on file  Transportation Needs:   . Lack of Transportation (Medical): Not on file  . Lack of Transportation (Non-Medical): Not on file  Physical Activity:   . Days of Exercise per Week: Not on file  . Minutes of Exercise per Session: Not on file  Stress:   . Feeling of Stress : Not on file  Social Connections:   .  Frequency of Communication with Friends and Family: Not on file  . Frequency of Social Gatherings with Friends and Family: Not on file  . Attends Religious Services: Not on file  . Active Member of Clubs or Organizations: Not on file  . Attends Banker Meetings: Not on file  . Marital Status: Not on file  Intimate Partner Violence:   . Fear of Current or Ex-Partner: Not on file  . Emotionally Abused: Not on file  . Physically Abused: Not on file  . Sexually Abused: Not on file   No current facility-administered  medications on file prior to encounter.   Current Outpatient Medications on File Prior to Encounter  Medication Sig Dispense Refill  . cyclobenzaprine (FLEXERIL) 10 MG tablet Take 1 tablet (10 mg total) by mouth 2 (two) times daily as needed for muscle spasms. 20 tablet 0  . etodolac (LODINE) 300 MG capsule Take 1 capsule (300 mg total) by mouth every 8 (eight) hours. 21 capsule 0  . HYDROcodone-acetaminophen (NORCO/VICODIN) 5-325 MG tablet Take 1 tablet by mouth every 6 (six) hours as needed. 10 tablet 0  . metFORMIN (GLUCOPHAGE) 1000 MG tablet Take 1,000 mg by mouth 2 (two) times daily with a meal.    . Multiple Vitamin (MULTIVITAMIN WITH MINERALS) TABS tablet Take 1 tablet by mouth daily.    Marland Kitchen omeprazole (PRILOSEC) 20 MG capsule Take 1 capsule (20 mg total) by mouth daily. 14 capsule 0   Allergies  Allergen Reactions  . Flexeril [Cyclobenzaprine] Other (See Comments)    Headaches, nausea, vomiting    I have reviewed patient's Past Medical Hx, Surgical Hx, Family Hx, Social Hx, medications and allergies.   ROS:  Review of Systems  Constitutional: Negative for chills and fever.  Gastrointestinal: Positive for abdominal pain. Negative for constipation and diarrhea.  Genitourinary: Positive for pelvic pain and vaginal bleeding. Negative for dysuria.  Musculoskeletal: Negative for myalgias.   Review of Systems  Other systems negative   Physical Exam  Physical Exam Patient Vitals for the past 24 hrs:  BP Temp Temp src Pulse Resp SpO2 Height Weight  03/05/20 0534 (!) 144/90 98.4 F (36.9 C) Oral 76 17 98 % 5\' 9"  (1.753 m) 122.5 kg   Constitutional: Well-developed, well-nourished female in no acute distress.  Cardiovascular: normal rate Respiratory: normal effort GI: Abd soft, non-tender. Pos BS x 4 MS: Extremities nontender, no edema, normal ROM Neurologic: Alert and oriented x 4.  GU: Neg CVAT.  PELVIC EXAM: scant red discharge, vaginal walls and external genitalia  normal  LAB RESULTS Results for orders placed or performed during the hospital encounter of 03/05/20 (from the past 24 hour(s))  Urinalysis, Routine w reflex microscopic Urine, Clean Catch     Status: Abnormal   Collection Time: 03/05/20  5:47 AM  Result Value Ref Range   Color, Urine AMBER (A) YELLOW   APPearance CLOUDY (A) CLEAR   Specific Gravity, Urine 1.026 1.005 - 1.030   pH 5.0 5.0 - 8.0   Glucose, UA NEGATIVE NEGATIVE mg/dL   Hgb urine dipstick LARGE (A) NEGATIVE   Bilirubin Urine NEGATIVE NEGATIVE   Ketones, ur NEGATIVE NEGATIVE mg/dL   Protein, ur 03/07/20 (A) NEGATIVE mg/dL   Nitrite NEGATIVE NEGATIVE   Leukocytes,Ua NEGATIVE NEGATIVE   RBC / HPF >50 (H) 0 - 5 RBC/hpf   WBC, UA 6-10 0 - 5 WBC/hpf   Bacteria, UA RARE (A) NONE SEEN   Squamous Epithelial / LPF 0-5 0 - 5  Mucus PRESENT   Pregnancy, urine POC     Status: None   Collection Time: 03/05/20  5:51 AM  Result Value Ref Range   Preg Test, Ur NEGATIVE NEGATIVE  Wet prep, genital     Status: Abnormal   Collection Time: 03/05/20  6:03 AM   Specimen: Vaginal  Result Value Ref Range   Yeast Wet Prep HPF POC NONE SEEN NONE SEEN   Trich, Wet Prep NONE SEEN NONE SEEN   Clue Cells Wet Prep HPF POC NONE SEEN NONE SEEN   WBC, Wet Prep HPF POC MODERATE (A) NONE SEEN   Sperm NONE SEEN   CBC     Status: None   Collection Time: 03/05/20  6:04 AM  Result Value Ref Range   WBC 7.4 4.0 - 10.5 K/uL   RBC 4.69 3.87 - 5.11 MIL/uL   Hemoglobin 12.9 12.0 - 15.0 g/dL   HCT 63.0 36 - 46 %   MCV 84.9 80.0 - 100.0 fL   MCH 27.5 26.0 - 34.0 pg   MCHC 32.4 30.0 - 36.0 g/dL   RDW 16.0 10.9 - 32.3 %   Platelets 186 150 - 400 K/uL   nRBC 0.0 0.0 - 0.2 %  hCG, quantitative, pregnancy     Status: Abnormal   Collection Time: 03/05/20  6:04 AM  Result Value Ref Range   hCG, Beta Chain, Quant, S 5 (H) <5 mIU/mL     IMAGING US OB LESS THAN 14 WEEKS WITH OB TRANSVAGINAL  Result Date: 03/05/2020 CLINICAL DATA:  Bleeding and cramping.  EXAM: OBSTETRIC <14 WK [place] AND TRANSVAGINAL OB US TECHNIQUE: Both transabdominal and transvaginal ultrasound examinations were performed for complete evaluation of the gestation as well as the maternal uterus, adnexal regions, and pelvic cul-de-sac. Transvaginal technique was performed to assess early pregnancy. COMPARISON:  CT 11/11/2019.  Ultrasound 02/19/2017. FINDINGS: Intrauterine gestational sac: None visualized Yolk sac:  None visualized Embryo:  None visualized Cardiac Activity: None visualized Subchorionic hemorrhage:  None visualized. Maternal uterus/adnexae: Unremarkable. No focal abnormality identified. No free pelvic fluid. IMPRESSION: No intrauterine pregnancy identified. No focal abnormality identified. Electronically Signed   By: Maisie Fus  Register   On: 03/05/2020 06:42   MAU Management/MDM: Ordered usual first trimester r/o ectopic labs.   Pelvic exam and cultures done Will check baseline Ultrasound to rule out ectopic.  This bleeding/pain can represent a normal pregnancy with bleeding, spontaneous abortion or even an ectopic which can be life-threatening.  The process as listed above helps to determine which of these is present.  Reviewed findings represent a miscarriage UPTs at home were positive x 3 and HCG here is 5.   Patient states they were planning IVF next month  ASSESSMENT Pregnancy with bleeding in the first trimester Spontaneous abortion  PLAN Discharge home Followup with OB/GYN provider Pt stable at time of discharge. Encouraged to return here or to other Urgent Care/ED if she develops worsening of symptoms, increase in pain, fever, or other concerning symptoms.    Wynelle Bourgeois CNM, MSN Certified Nurse-Midwife 03/05/2020  5:45 AM

## 2020-11-09 ENCOUNTER — Other Ambulatory Visit: Payer: Self-pay | Admitting: Home Modifications

## 2020-11-12 ENCOUNTER — Other Ambulatory Visit: Payer: Self-pay | Admitting: Home Modifications

## 2020-11-12 DIAGNOSIS — R140 Abdominal distension (gaseous): Secondary | ICD-10-CM

## 2020-11-12 DIAGNOSIS — R1011 Right upper quadrant pain: Secondary | ICD-10-CM

## 2020-11-23 ENCOUNTER — Other Ambulatory Visit: Payer: Self-pay | Admitting: Home Modifications

## 2020-11-23 DIAGNOSIS — R109 Unspecified abdominal pain: Secondary | ICD-10-CM

## 2020-11-23 DIAGNOSIS — R140 Abdominal distension (gaseous): Secondary | ICD-10-CM

## 2020-11-24 IMAGING — US US THYROID
1 series · 13 of 25 positions shown · non-contrast
Comparison: None available

CLINICAL DATA: Goiter.

EXAM:
THYROID ULTRASOUND
TECHNIQUE: Ultrasound examination of the thyroid gland and adjacent soft
tissues was performed.

[Series 1: us thyroid · 0.06mm/px · 13 of 35 slices shown]
[im 1/35]
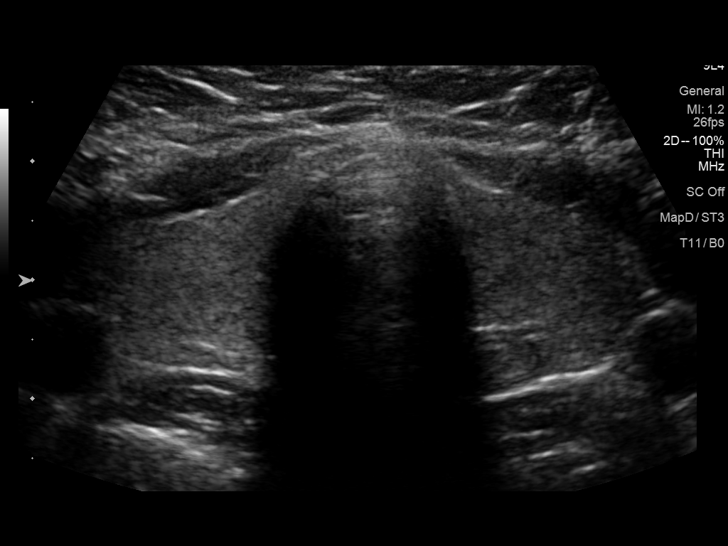
[im 3/35]
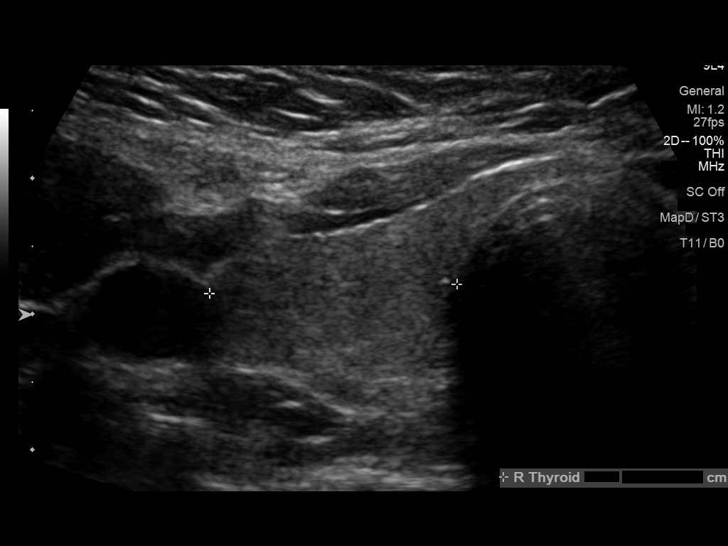
[im 6/35]
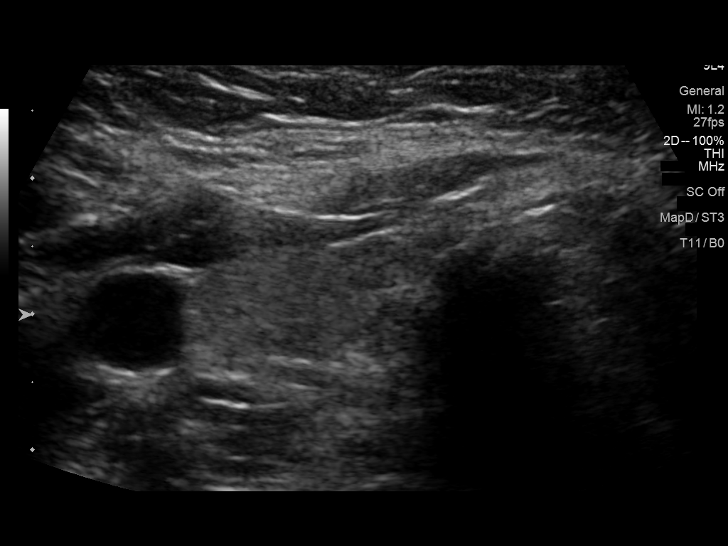
[im 9/35]
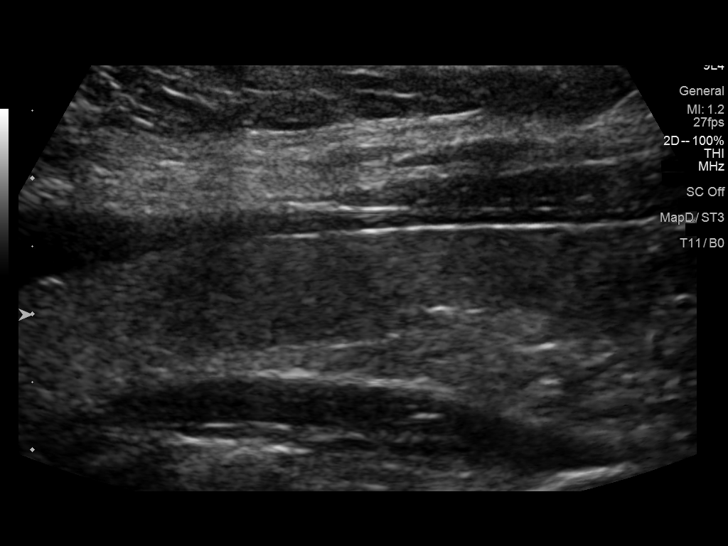
[im 12/35]
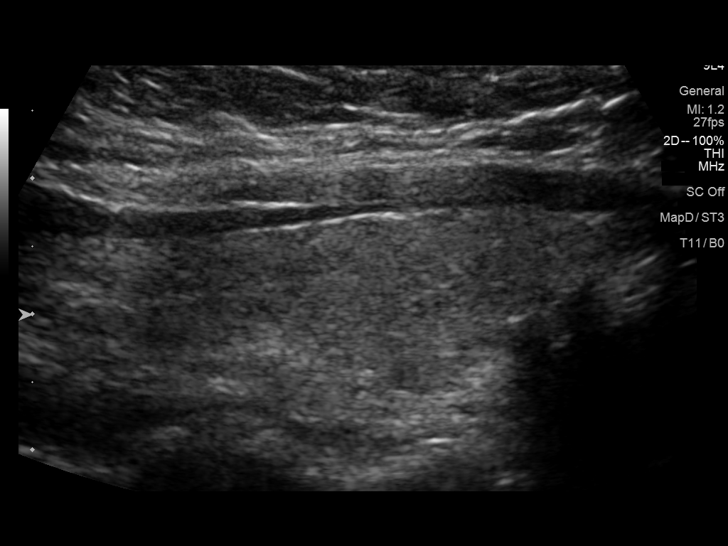
[im 15/35]
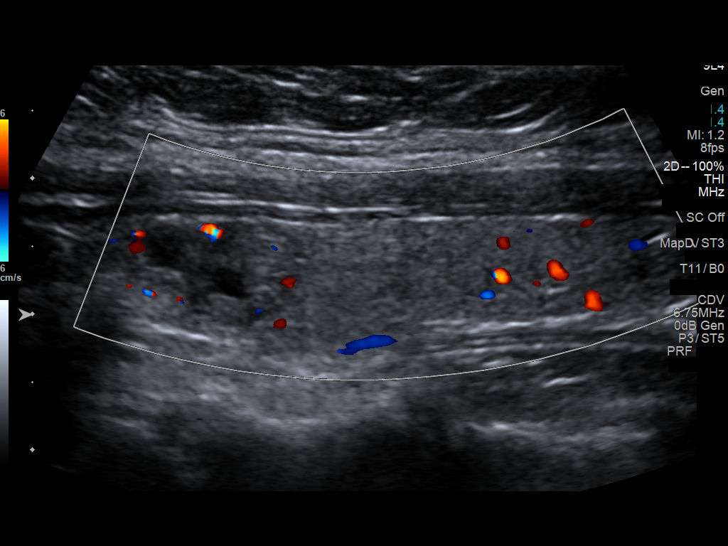
[im 18/35]
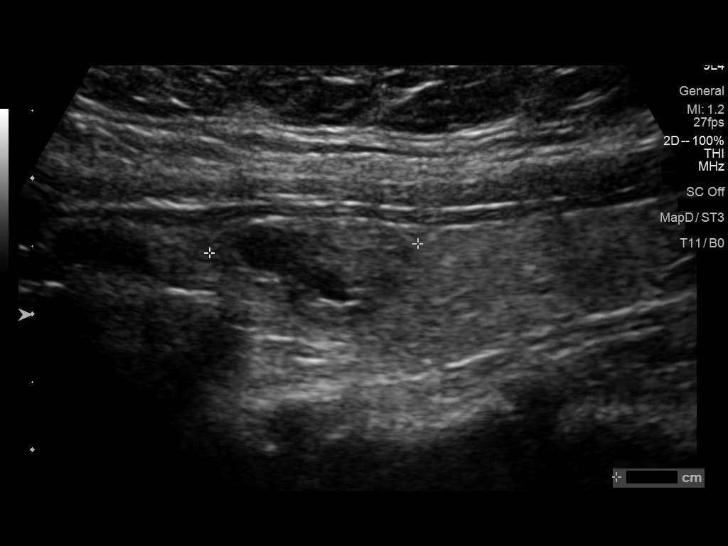
[im 20/35]
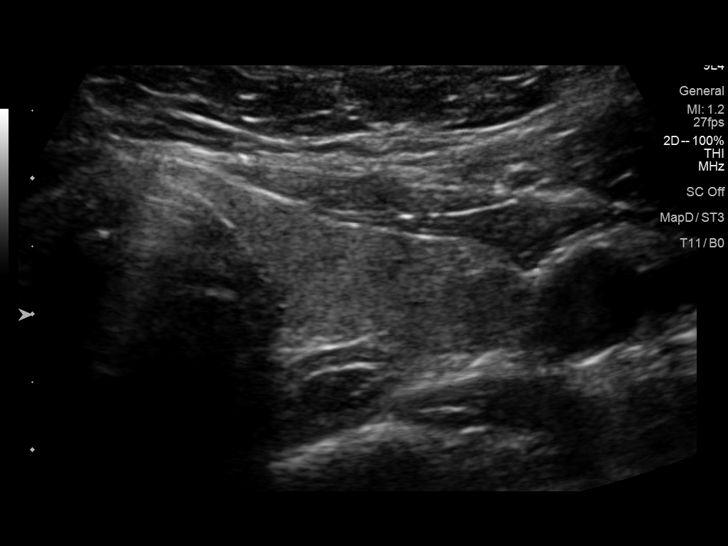
[im 23/35]
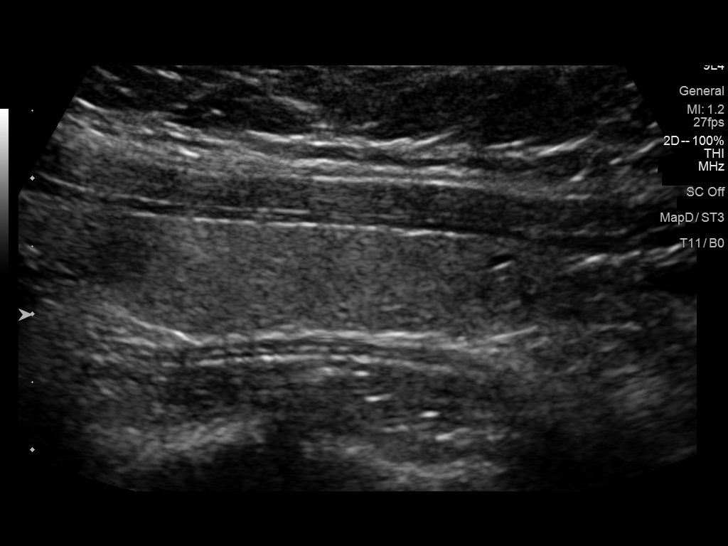
[im 26/35]
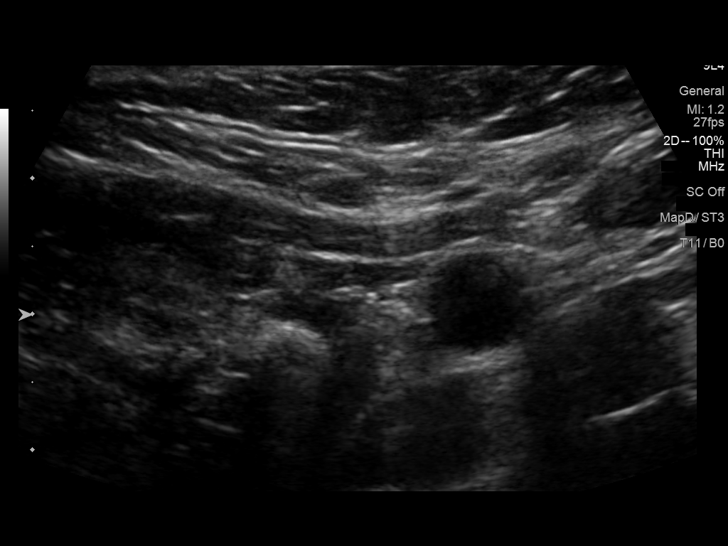
[im 29/35]
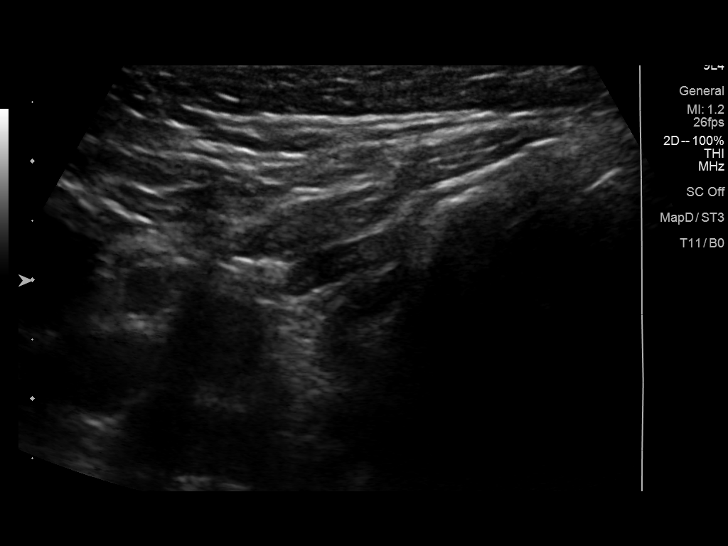
[im 32/35]
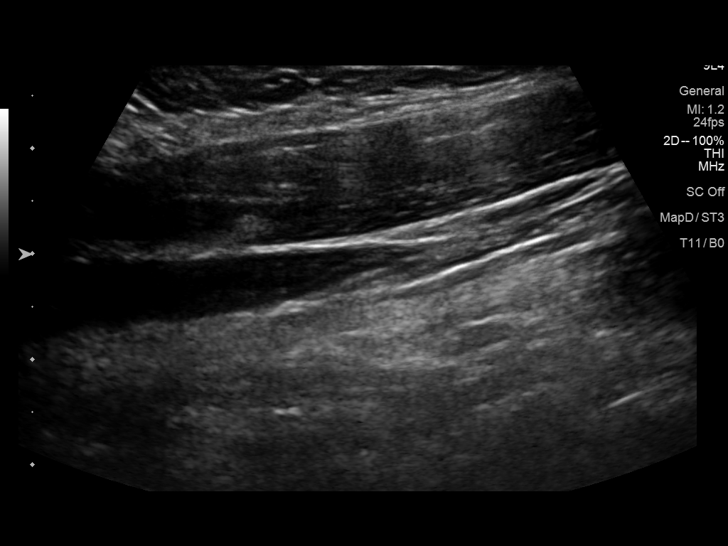
[im 35/35]
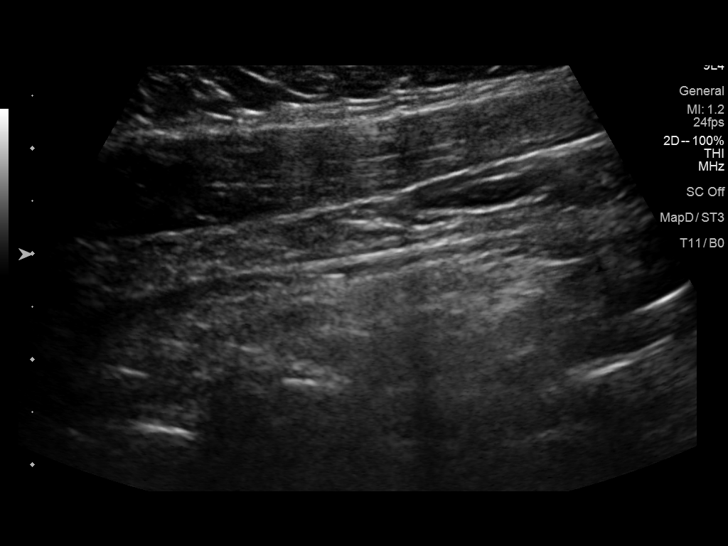

[13 of 25 positions shown; findings below may reference images not displayed]

FINDINGS: Parenchymal Echotexture: Normal

Isthmus: 3 mm

Right lobe: 6.0 x 1.3 x 1.8 cm

Left lobe: 4.7 x 0.9 x 1.7 cm

_________________________________________________________

Estimated total number of nodules >/= 1 cm: 1

Number of spongiform nodules >/=  2 cm not described below (TR1): 0

Number of mixed cystic and solid nodules >/= 1.5 cm not described
below (TR2): 0

_________________________________________________________

Nodule # 1:

Location: Left; Superior

Maximum size: 1.5 cm; Other 2 dimensions: 1.0 x 0.8 cm

Composition: mixed cystic and solid (1)

Echogenicity: isoechoic (1)

Shape: not taller-than-wide (0)

Margins: ill-defined (0)

Echogenic foci: none (0)

ACR TI-RADS total points: 2.

ACR TI-RADS risk category: TR2 (2 points).

ACR TI-RADS recommendations:

This nodule does NOT meet TI-RADS criteria for biopsy or dedicated
follow-up.

_________________________________________________________

Normal thyroid echotexture and vascularity.  No regional adenopathy.
IMPRESSION: 1.5 cm left superior TR 2 nodule does not meet criteria for biopsy
or follow-up.

No other significant finding by ultrasound.

The above is in keeping with the ACR TI-RADS recommendations - [HOSPITAL] 3132;[DATE].

## 2020-11-26 ENCOUNTER — Ambulatory Visit
Admission: RE | Admit: 2020-11-26 | Discharge: 2020-11-26 | Disposition: A | Discharge status: Home / Self Care | Payer: Commercial Managed Care - PPO | Source: Ambulatory Visit | Attending: Home Modifications | Admitting: Home Modifications

## 2020-11-26 DIAGNOSIS — R109 Unspecified abdominal pain: Secondary | ICD-10-CM

## 2020-11-26 DIAGNOSIS — R140 Abdominal distension (gaseous): Secondary | ICD-10-CM

## 2020-12-16 ENCOUNTER — Emergency Department (HOSPITAL_COMMUNITY): Payer: Commercial Managed Care - PPO

## 2020-12-16 ENCOUNTER — Encounter (HOSPITAL_COMMUNITY): Payer: Self-pay

## 2020-12-16 ENCOUNTER — Encounter: Payer: Self-pay | Admitting: Cardiology

## 2020-12-16 ENCOUNTER — Emergency Department (HOSPITAL_COMMUNITY)
Admission: EM | Admit: 2020-12-16 | Discharge: 2020-12-16 | Disposition: A | Discharge status: Home / Self Care | Payer: Commercial Managed Care - PPO | Attending: Emergency Medicine | Admitting: Emergency Medicine

## 2020-12-16 ENCOUNTER — Ambulatory Visit: Payer: Commercial Managed Care - PPO | Admitting: Cardiology

## 2020-12-16 ENCOUNTER — Other Ambulatory Visit: Payer: Self-pay

## 2020-12-16 VITALS — BP 123/64 | HR 90 | Temp 98.0°F | Resp 16 | Ht 69.0 in | Wt 281.0 lb

## 2020-12-16 DIAGNOSIS — R9431 Abnormal electrocardiogram [ECG] [EKG]: Secondary | ICD-10-CM

## 2020-12-16 DIAGNOSIS — R0600 Dyspnea, unspecified: Secondary | ICD-10-CM | POA: Diagnosis not present

## 2020-12-16 DIAGNOSIS — R0602 Shortness of breath: Secondary | ICD-10-CM | POA: Diagnosis not present

## 2020-12-16 DIAGNOSIS — R519 Headache, unspecified: Secondary | ICD-10-CM | POA: Diagnosis not present

## 2020-12-16 DIAGNOSIS — I10 Essential (primary) hypertension: Secondary | ICD-10-CM | POA: Insufficient documentation

## 2020-12-16 DIAGNOSIS — R42 Dizziness and giddiness: Secondary | ICD-10-CM | POA: Diagnosis not present

## 2020-12-16 DIAGNOSIS — J45909 Unspecified asthma, uncomplicated: Secondary | ICD-10-CM | POA: Diagnosis not present

## 2020-12-16 DIAGNOSIS — Z79899 Other long term (current) drug therapy: Secondary | ICD-10-CM | POA: Insufficient documentation

## 2020-12-16 DIAGNOSIS — Z6841 Body Mass Index (BMI) 40.0 and over, adult: Secondary | ICD-10-CM

## 2020-12-16 DIAGNOSIS — R6889 Other general symptoms and signs: Secondary | ICD-10-CM

## 2020-12-16 DIAGNOSIS — E039 Hypothyroidism, unspecified: Secondary | ICD-10-CM | POA: Insufficient documentation

## 2020-12-16 DIAGNOSIS — I951 Orthostatic hypotension: Secondary | ICD-10-CM

## 2020-12-16 DIAGNOSIS — Z87891 Personal history of nicotine dependence: Secondary | ICD-10-CM

## 2020-12-16 DIAGNOSIS — E282 Polycystic ovarian syndrome: Secondary | ICD-10-CM

## 2020-12-16 DIAGNOSIS — R55 Syncope and collapse: Secondary | ICD-10-CM

## 2020-12-16 LAB — BASIC METABOLIC PANEL
Anion gap: 10 (ref 5–15)
BUN: 12 mg/dL (ref 6–20)
CO2: 25 mmol/L (ref 22–32)
Calcium: 9.4 mg/dL (ref 8.9–10.3)
Chloride: 98 mmol/L (ref 98–111)
Creatinine, Ser: 0.82 mg/dL (ref 0.44–1.00)
GFR, Estimated: >60 mL/min (ref >60)
Glucose, Bld: 98 mg/dL (ref 70–99)
Potassium: 3.5 mmol/L (ref 3.5–5.1)
Sodium: 133 mmol/L — ABNORMAL LOW (ref 135–145)

## 2020-12-16 LAB — CBC
HCT: 41.1 % (ref 36.0–46.0)
Hemoglobin: 13.5 g/dL (ref 12.0–15.0)
MCH: 27.9 pg (ref 26.0–34.0)
MCHC: 32.8 g/dL (ref 30.0–36.0)
MCV: 84.9 fL (ref 80.0–100.0)
Platelets: 217 10*3/uL (ref 150–400)
RBC: 4.84 MIL/uL (ref 3.87–5.11)
RDW: 13.6 % (ref 11.5–15.5)
WBC: 9.3 10*3/uL (ref 4.0–10.5)
nRBC: 0 % (ref 0.0–0.2)

## 2020-12-16 LAB — I-STAT BETA HCG BLOOD, ED (MC, WL, AP ONLY): I-stat hCG, quantitative: <5 m[IU]/mL (ref <5)

## 2020-12-16 LAB — TROPONIN I (HIGH SENSITIVITY)
Troponin I (High Sensitivity): 4 ng/L (ref <18)
Troponin I (High Sensitivity): 2 ng/L (ref <18)

## 2020-12-16 MED ORDER — IOHEXOL 350 MG/ML SOLN
50.0000 mL | Freq: Once | INTRAVENOUS | Status: AC | PRN
  Administered 2020-12-16: 50 mL via INTRAVENOUS

## 2020-12-16 NOTE — ED Notes (Signed)
Patient transported to CT 

## 2020-12-16 NOTE — ED Triage Notes (Signed)
Patient complains of dizziness, lightheadedness, SOB and was sent by cardiology office for further evaluation as patient reports abnormal ekg-denies cp on arrival. Speaking full sentences

## 2020-12-16 NOTE — ED Provider Notes (Signed)
[facility] EM[facility]ENC[facility] Provider Note   CSN: [id number] Arrival date & time: 12/16/20  1226     History No chief complaint on file.   Desiree Singh is a 40 y.o. female with a past medical history of anxiety, hypertension, asthma presenting to the ED for dizziness, shortness of breath, lightheadedness and sent from cardiology office.  For the past 3 months she has been experiencing worsening dyspnea, lightheadedness.  She had a 20-hour round trip car ride in April 2022.  She has noticed over the past few months that her blood pressure has been running high when she monitors it at home.  Also reports frequent headaches because of this.  She initially saw her PCP and was told that she need to be on blood pressure medication.  States that she has had many medication trials but continued to be symptomatic.  She was then sent to the cardiologist who she saw for the first time today.  She had positive orthostatic vital signs.  She was sent to the ER to rule out PE as a cause of her symptoms.  Denies any chest pain.  She has bilateral lower extremity edema at baseline and this is unchanged.  Denies any abdominal pain, vomiting, fever or cough.  No history of DVT, PE, MI.  HPI     Past Medical History:  Diagnosis Date   Anxiety    Asthma    Headache    History of PCOS    Hypertension    Hypothyroidism    2009-2012    Patient Active Problem List   Diagnosis Date Noted   Breech presentation 08/04/2017   Headache in pregnancy, antepartum, third trimester 07/07/2017   Hypertension affecting pregnancy 07/07/2017    Past Surgical History:  Procedure Laterality Date   CESAREAN SECTION N/A 08/04/2017   Procedure: CESAREAN SECTION;  Surgeon: Harold Hedgeomblin, James, MD;  [facility];  Service: Obstetrics;  Laterality: N/A;   TYMPANOSTOMY TUBE PLACEMENT       OB History     Gravida  3   Para  2   Term  2   Preterm      AB  1   Living  2       SAB  1   IAB      Ectopic      Multiple  0   Live Births  2           Family History  Problem Relation Age of Onset   Hypertension Mother    Hypertension Maternal Aunt    Diabetes Maternal Aunt    Hypertension Maternal Grandmother    Colon cancer Maternal Grandfather     Social History   Tobacco Use   Smoking status: Never   Smokeless tobacco: Never  Vaping Use   Vaping Use: Never used  Substance Use Topics   Alcohol use: No   Drug use: No    Home Medications Prior to Admission medications   Medication Sig Start Date End Date Taking? Authorizing Provider  lisinopril-hydrochlorothiazide (ZESTORETIC) 20-25 MG tablet Take 1 tablet by mouth daily. 10/14/20  Yes [provider]  metoprolol succinate (TOPROL-XL) 25 MG 24 hr tablet Take 25 mg by mouth daily. 12/08/20  Yes [provider]  Multiple Vitamin (MULTIVITAMIN WITH MINERALS) TABS tablet Take 1 tablet by mouth daily.   Yes [provider]  pantoprazole (PROTONIX) 40 MG tablet Take 40 mg by mouth daily. 10/14/20  Yes [provider]  Allergies    Flexeril [cyclobenzaprine]  Review of Systems   Review of Systems  Constitutional:  Negative for appetite change, chills and fever.  HENT:  Negative for ear pain, rhinorrhea, sneezing and sore throat.   Eyes:  Negative for photophobia and visual disturbance.  Respiratory:  Positive for shortness of breath. Negative for cough, chest tightness and wheezing.   Cardiovascular:  Negative for chest pain and palpitations.  Gastrointestinal:  Negative for abdominal pain, blood in stool, constipation, diarrhea, nausea and vomiting.  Genitourinary:  Negative for dysuria, hematuria and urgency.  Musculoskeletal:  Negative for myalgias.  Skin:  Negative for rash.  Neurological:  Positive for dizziness and light-headedness. Negative for weakness.   Physical Exam Updated Vital Signs BP 126/67   Pulse 75   Temp 98.6 F (37 C) (Oral)   Resp  17   Ht 5\' 9"  (1.753 m)   Wt 127 kg   LMP 11/18/2020   SpO2 100%   BMI 41.35 kg/m   Physical Exam Vitals and nursing note reviewed.  Constitutional:      General: She is not in acute distress.    Appearance: She is well-developed.     Comments: Speaking complete sentences without difficulty.  HENT:     Head: Normocephalic and atraumatic.     Nose: Nose normal.  Eyes:     General: No scleral icterus.       Right eye: No discharge.        Left eye: No discharge.     Conjunctiva/sclera: Conjunctivae normal.  Cardiovascular:     Rate and Rhythm: Normal rate and regular rhythm.     Heart sounds: Normal heart sounds. No murmur heard.   No friction rub. No gallop.  Pulmonary:     Effort: Pulmonary effort is normal. No respiratory distress.     Breath sounds: Normal breath sounds.  Abdominal:     General: Bowel sounds are normal. There is no distension.     Palpations: Abdomen is soft.     Tenderness: There is no abdominal tenderness. There is no guarding.  Musculoskeletal:        General: Normal range of motion.     Cervical back: Normal range of motion and neck supple.     Right lower leg: No edema.     Left lower leg: No edema.  Skin:    General: Skin is warm and dry.     Findings: No rash.  Neurological:     Mental Status: She is alert.     Motor: No abnormal muscle tone.     Coordination: Coordination normal.    ED Results / Procedures / Treatments   Labs (all labs ordered are listed, but only abnormal results are displayed) Labs Reviewed  BASIC METABOLIC PANEL - Abnormal; Notable for the following components:      Result Value   Sodium 133 (*)    All other components within normal limits  CBC  I-STAT BETA HCG BLOOD, ED ([facility], [facility], [facility] ONLY)  TROPONIN I (HIGH SENSITIVITY)  TROPONIN I (HIGH SENSITIVITY)    EKG None  Radiology DG Chest 2 View  Result Date: 12/16/2020 CLINICAL DATA:  Shortness of breath progression. EXAM: CHEST - 2 VIEW COMPARISON:  Two chest  x-ray 12/19/2017 FINDINGS: The heart size and mediastinal contours are within normal limits. Both lungs are clear. The visualized skeletal structures are unremarkable. IMPRESSION: No active cardiopulmonary disease. Electronically Signed   By: 12/21/2017 M.D.   On: 12/16/2020  13:23   CT Angio Chest PE W and/or Wo Contrast  Result Date: 12/16/2020 CLINICAL DATA:  Shortness of breath, PE suspected EXAM: CT ANGIOGRAPHY CHEST WITH CONTRAST TECHNIQUE: Multidetector CT imaging of the chest was performed using the standard protocol during bolus administration of intravenous contrast. Multiplanar CT image reconstructions and MIPs were obtained to evaluate the vascular anatomy. CONTRAST:  22mL OMNIPAQUE IOHEXOL 350 MG/ML SOLN COMPARISON:  None. FINDINGS: Cardiovascular: Satisfactory opacification of the pulmonary arteries to the segmental level. No evidence of pulmonary embolism. No thoracic aortic aneurysm. Normal heart size. No pericardial effusion. No suspicious intracardiac filling defect. Mediastinum/Nodes: No discrete thyroid nodule. No pathologically enlarged mediastinal, hilar or axillary lymph nodes. Small hiatal hernia. Trachea and esophagus are grossly unremarkable. Lungs/Pleura: Patchy left lower lobe ground-glass opacities for instance on image 50, 57 and 61/6 with mild diffuse bronchial wall thickening. No pleural effusion. No pneumothorax. Upper Abdomen: No acute abnormality. Musculoskeletal: Thoracic diffuse idiopathic skeletal hyperostosis. No acute osseous abnormality. Review of the MIP images confirms the above findings. IMPRESSION: 1. No evidence of pulmonary embolism. 2. Patchy left lower lobe ground-glass opacities with mild diffuse bronchial wall thickening, suggestive of an infectious or inflammatory process. 3. Small hiatal hernia. Electronically Signed   By: Maudry Mayhew MD   On: 12/16/2020 17:18    Procedures Procedures   Medications Ordered in ED Medications  iohexol  (OMNIPAQUE) 350 MG/ML injection 50 mL (50 mLs Intravenous Contrast Given 12/16/20 1655)    ED Course  I have reviewed the triage vital signs and the nursing notes.  Pertinent labs & imaging results that were available during my care of the patient were reviewed by me and considered in my medical decision making (see chart for details).    MDM Rules/Calculators/A&P                          40 year old female presenting to the ED for dizziness, lightheadedness and shortness of breath.  Sent from cardiology office for CT angio to rule out PE.  She has been having symptoms for the past 3 months.  These persist despite being on antihypertensives.  She had positive orthostatic vital signs at cardiology office.  Recent prolonged car ride for travel within the past 3 months.  EKG here shows normal sinus rhythm.  Chest x-ray is unremarkable.  CBC, BMP and troponin negative x1.  Will obtain CT angio of the chest as well as a troponin and reassess.  -Troponin is negative.  CT angio negative for PE.  There is an area of possible inflammatory infectious process.  She is denying any current symptoms that could be related to pneumonia.  This could be chronic in nature.  Vital signs remained within normal limits.  Will defer remainder of work-up to Dr. Odis Hollingshead per his request outpatient.  Return precautions given    Patient is hemodynamically stable, in NAD, and able to ambulate in the ED. Evaluation does not show pathology that would require ongoing emergent intervention or inpatient treatment. I explained the diagnosis to the patient. Pain has been managed and has no complaints prior to discharge. Patient is comfortable with above plan and is stable for discharge at this time. All questions were answered prior to disposition. Strict return precautions for returning to the ED were discussed. Encouraged follow up with PCP.   An After Visit Summary was printed and given to the patient.   Portions of this note  were generated with Scientist, clinical (histocompatibility and immunogenetics).  Dictation errors may occur despite best attempts at proofreading.  Final Clinical Impression(s) / ED Diagnoses Final diagnoses:  Shortness of breath    Rx / DC Orders ED Discharge Orders     None        Dietrich Pates, PA-C 12/16/20 1738    Milagros Loll, MD 12/18/20 1143

## 2020-12-16 NOTE — Discharge Instructions (Addendum)
Follow-up with your cardiologist and primary care provider. Return to the ER if you start to experience chest pain, worsening shortness of breath or leg swelling, lightheadedness or loss of consciousness.

## 2020-12-16 NOTE — Progress Notes (Signed)
Date:  12/16/2020   ID:  [name], DOB 09-Jul-1980, MRN [id number]  PCP:  [facility] [place] @ [place]  Cardiologist:  Rex Kras, DO, [facility] (established care 12/16/2020)  REASON FOR CONSULT: Shortness of breath, near-syncope, decreased exercise tolerance  REQUESTING PHYSICIAN:  [facility], [place] @ [place],   [id number]  Chief Complaint  Patient presents with   Hypertension   Dizziness   New Patient (Initial Visit)   Shortness of Breath    HPI  Desiree Singh is a 40 y.o. female who presents to the office with a chief complaint of " shortness of breath, decreased exercise tolerance, nearly passing out." Patient's past medical history and cardiovascular risk factors include: Hypertension, history of hypothyroidism, depression, GERD, amenorrhea, PCOS, former smoker, obesity due to excess calories.  She is referred to the office at the request of [facility] for evaluation of Shortness of breath, near-syncope, decreased exercise tolerance.  She states that since March/April 2022 she has been experiencing shortness of breath with effort related activities, dizziness with changing positions, decreased exercise tolerance, and recently has started noticing her blood pressures being elevated.   Since March 2022 she has been on 2 antihypertensive medications and approximately 2 weeks ago was started on metoprolol as well for elevated heart rate.  Recently has also started noticing decrease in physical endurance not able to do her HIT training that she was very capable of doing 2 or 3 months ago.  Patient denies any chest pain at rest or with effort related activities.  Patient is currently not on oral contraceptives or has an IUD in place.  Both the patient and her husband are planning to have kids and were undergoing fertility treatment up until November 2021.  She denies any exogenous use of hormone replacement therapy  since November 2021.  No prior history of pulmonary embolism or DVT.  Patient states that in March 2022 that had a family trip to [place] and drove 10 hours and tried their best to take frequent breaks along the way.  Evaluation of sudden onset of elevated blood pressure, exertional dizziness/shortness of breath over the last 2 or 3 months, now on 3 blood pressure medications previously none  FUNCTIONAL STATUS: 3 months ago she used to work out 2 or 3 times a week for 60 minutes at a time which included cardio, HIT training, interval training.  ALLERGIES: Allergies  Allergen Reactions   Flexeril [Cyclobenzaprine] Other (See Comments)    Headaches, nausea, vomiting    MEDICATION LIST PRIOR TO VISIT: Current Meds  Medication Sig   lisinopril-hydrochlorothiazide (ZESTORETIC) 20-12.5 MG tablet Take 1 tablet by mouth daily.   metoprolol succinate (TOPROL-XL) 25 MG 24 hr tablet Take 25 mg by mouth daily.   Multiple Vitamin (MULTIVITAMIN WITH MINERALS) TABS tablet Take 1 tablet by mouth daily.   pantoprazole (PROTONIX) 40 MG tablet Take 40 mg by mouth daily.     PAST MEDICAL HISTORY: Past Medical History:  Diagnosis Date   Anxiety    Asthma    Headache    History of PCOS    Hypertension    Hypothyroidism    2009-2012    PAST SURGICAL HISTORY: Past Surgical History:  Procedure Laterality Date   CESAREAN SECTION N/A 08/04/2017   Procedure: CESAREAN SECTION;  Surgeon: Everlene Farrier, MD;  Location: [facility];  Service: Obstetrics;  Laterality: N/A;   TYMPANOSTOMY TUBE PLACEMENT      FAMILY HISTORY: The  patient family history includes Colon cancer in her maternal grandfather; Diabetes in her maternal aunt; Hypertension in her maternal aunt, maternal grandmother, and mother. No family history of premature coronary disease or sudden cardiac death.  SOCIAL HISTORY:  The patient  reports that she has never smoked. She has never used smokeless tobacco. She reports that  she does not drink alcohol and does not use drugs.  REVIEW OF SYSTEMS: Review of Systems  Constitutional: Negative for chills and fever.  HENT:  Negative for hoarse voice and nosebleeds.   Eyes:  Negative for discharge, double vision and pain.  Cardiovascular:  Positive for dyspnea on exertion and near-syncope. Negative for chest pain, claudication, leg swelling, orthopnea, palpitations, paroxysmal nocturnal dyspnea and syncope.  Respiratory:  Positive for shortness of breath. Negative for hemoptysis.   Musculoskeletal:  Negative for muscle cramps and myalgias.  Gastrointestinal:  Negative for abdominal pain, constipation, diarrhea, hematemesis, hematochezia, melena, nausea and vomiting.  Neurological:  Positive for dizziness and light-headedness.   PHYSICAL EXAM: Vitals with BMI 12/16/2020 12/16/2020 03/05/2020  Height - 5' 9"  -  Weight - 281 lbs -  BMI - 81.19 -  Systolic 147 829 562  Diastolic 96 64 90  Pulse 130 90 85   Orthostatic VS for the past 72 hrs (Last 3 readings):  Orthostatic BP Patient Position BP Location Cuff Size Orthostatic Pulse  12/16/20 1147 (!) 80/53 Standing Left Arm Large 69  12/16/20 1146 133/85 Sitting Left Arm Large 80  12/16/20 1145 133/84 Supine Left Arm Large 85   CONSTITUTIONAL: Age-appropriate female, hemodynamically stable, well-developed and well-nourished. No acute distress.  SKIN: Skin is warm and dry. No rash noted. No cyanosis. No pallor. No jaundice HEAD: Normocephalic and atraumatic.  EYES: No scleral icterus MOUTH/THROAT: Moist oral membranes.  NECK: No JVD present. No thyromegaly noted. No carotid bruits  LYMPHATIC: No visible cervical adenopathy.  CHEST Normal respiratory effort. No intercostal retractions  LUNGS: Clear to auscultation bilaterally. no stridor. No wheezes. No rales.  CARDIOVASCULAR: Regular rate and rhythm, positive S1-S2, no murmurs rubs or gallops appreciated. ABDOMINAL: Obese, soft, nontender, nondistended, positive  bowel sounds in all 4 quadrants no apparent ascites.  EXTREMITIES: No peripheral edema, varicose veins present bilaterally, warm to touch bilaterally, equal circumference of bilateral lower extremities visually. HEMATOLOGIC: No significant bruising NEUROLOGIC: Oriented to person, place, and time. Nonfocal. Normal muscle tone.  PSYCHIATRIC: Normal mood and affect. Normal behavior. Cooperative  CARDIAC DATABASE: EKG: 12/16/2020: Normal sinus rhythm, 76 bpm, left atrial enlargement, LAE, without underlying ischemia, (S1, Q3, inverted T lead 3) consider pulmonary embolism evaluation if clinically appropriate.  Echocardiogram: No results found for this or any previous visit from the past 1095 days.   Stress Testing: No results found for this or any previous visit from the past 1095 days.  Heart Catheterization: None  LABORATORY DATA: CBC Latest Ref Rng & Units 03/05/2020 12/16/2019 11/11/2019  WBC 4.0 - 10.5 K/uL 7.4 8.8 9.6  Hemoglobin 12.0 - 15.0 g/dL 12.9 13.3 13.1  Hematocrit 36.0 - 46.0 % 39.8 40.0 40.3  Platelets 150 - 400 K/uL 186 178 227    CMP Latest Ref Rng & Units 12/16/2019 11/11/2019 11/19/2017  Glucose 70 - 99 mg/dL 107(H) 122(H) 115(H)  BUN 6 - 20 mg/dL 14 14 19   Creatinine 0.44 - 1.00 mg/dL 0.69 0.77 0.90  Sodium 135 - 145 mmol/L 135 138 137  Potassium 3.5 - 5.1 mmol/L 3.8 4.6 3.7  Chloride 98 - 111 mmol/L 102 107 101  CO2  22 - 32 mmol/L 22 22 27   Calcium 8.9 - 10.3 mg/dL 8.4(L) 9.0 9.3  Total Protein 6.5 - 8.1 g/dL 7.1 7.6 -  Total Bilirubin 0.3 - 1.2 mg/dL 0.7 0.6 -  Alkaline Phos 38 - 126 U/L 55 57 -  AST 15 - 41 U/L 18 25 -  ALT 0 - 44 U/L 30 24 -    Lipid Panel  No results found for: CHOL, TRIG, HDL, CHOLHDL, VLDL, LDLCALC, LDLDIRECT, LABVLDL  No components found for: NTPROBNP No results for input(s): PROBNP in the last 8760 hours. No results for input(s): TSH in the last 8760 hours.  BMP No results for input(s): NA, K, CL, CO2, GLUCOSE, BUN, CREATININE,  CALCIUM, GFRNONAA, GFRAA in the last 8760 hours.  HEMOGLOBIN A1C No results found for: HGBA1C, MPG  External Labs: Collected: 09/30/2020 Hemoglobin 13.8 g/dL, hematocrit 41.7% Creatinine 0.75 mg/dL. eGFR: >60 mL/min per 1.73 m Hemoglobin A1c: 5.5 TSH: 2.2  IMPRESSION:    ICD-10-CM   1. Shortness of breath  R06.02     2. Near syncope  R55     3. Decreased exercise tolerance  R68.89     4. Orthostatic hypotension  I95.1     5. Abnormal EKG  R94.31     6. Benign hypertension  I10 EKG 12-Lead    7. PCOS (polycystic ovarian syndrome)  E28.2     8. Former smoker  Z87.891     94. Class 3 severe obesity due to excess calories without serious comorbidity with body mass index (BMI) of 40.0 to 44.9 in adult [facility])  E66.01    Z68.41        RECOMMENDATIONS: Desiree Singh is a 40 y.o. female whose past medical history and cardiac risk factors include: Hypertension, history of hypothyroidism, depression, GERD, amenorrhea, PCOS, former smoker, obesity due to excess calories.  Patient has been experiencing shortness of breath with effort related activities, orthostatic with changing positions, episodes of near syncope, underlying tachycardia for which she was started on metoprolol 2 weeks ago.  She had a recent prolonged car ride in March 2022 prior to these symptoms starting and they have been progressively getting worse.  EKG today shows normal sinus rhythm with left atrial enlargement and S1 every 3 and inverted T3 pattern.  Patient not exhibiting sinus tachycardia as she is currently on decent dose of beta-blocker therapy.  Clinical suspicion is relatively high for pulmonary embolism given her symptoms.  Recommended going to the ER for more expedited evaluation.  Patient verbalized understanding and agrees.  Patient refused EMS transfer.  Patient is orthostatic on vital signs at today's office visit.  I suspect that this is most likely iatrogenic induced as if she was been started on 3  antihypertensive medications over the course of the last 2 months.  We will follow-up.  If the pulmonary embolism evaluation is unremarkable I would recommend proceeding with lower extremity duplex to rule out deep venous thrombosis, echocardiogram to evaluate for structural heart disease, and a stress test versus coronary CTA to evaluate for CAD.  I would like to see her back in 1 week post ER visit to continue her care given her clinical presentation.  Total encounter time 62 minutes. *Total Encounter Time as defined by the Centers for Medicare and Medicaid Services includes, in addition to the face-to-face time of a patient visit (documented in the note above) non-face-to-face time: obtaining and reviewing outside history (independently office notes, laboratory values, as a part of this consultation summarized  findings noted above), ordering and reviewing medications, tests or procedures, discussing disease management as well as the differential diagnosis, complex medical decision patient referred to the emergency room to rule out pulmonary emboli, care coordination (communications with other health care professionals or caregivers) and documentation in the medical record.   FINAL MEDICATION LIST END OF ENCOUNTER: No orders of the defined types were placed in this encounter.   Medications Discontinued During This Encounter  Medication Reason   cyclobenzaprine (FLEXERIL) 10 MG tablet Error   HYDROcodone-acetaminophen (NORCO/VICODIN) 5-325 MG tablet Error   etodolac (LODINE) 300 MG capsule Error   metFORMIN (GLUCOPHAGE) 1000 MG tablet Error   omeprazole (PRILOSEC) 20 MG capsule Error     Current Outpatient Medications:    lisinopril-hydrochlorothiazide (ZESTORETIC) 20-12.5 MG tablet, Take 1 tablet by mouth daily., Disp: , Rfl:    metoprolol succinate (TOPROL-XL) 25 MG 24 hr tablet, Take 25 mg by mouth daily., Disp: , Rfl:    Multiple Vitamin (MULTIVITAMIN WITH MINERALS) TABS tablet, Take 1  tablet by mouth daily., Disp: , Rfl:    pantoprazole (PROTONIX) 40 MG tablet, Take 40 mg by mouth daily., Disp: , Rfl:   Orders Placed This Encounter  Procedures   EKG 12-Lead    There are no Patient Instructions on file for this visit.   --Continue cardiac medications as reconciled in final medication list. --Return in about 1 week (around 12/23/2020) for Follow up, Dyspnea. Or sooner if needed. --Continue follow-up with your primary care physician regarding the management of your other chronic comorbid conditions.  Patient's questions and concerns were addressed to her satisfaction. She voices understanding of the instructions provided during this encounter.   This note was created using a voice recognition software as a result there may be grammatical errors inadvertently enclosed that do not reflect the nature of this encounter. Every attempt is made to correct such errors.  Rex Kras, [place], [facility]  Pager: [phone] Office: [phone]

## 2020-12-24 ENCOUNTER — Encounter: Payer: Self-pay | Admitting: Cardiology

## 2020-12-24 ENCOUNTER — Other Ambulatory Visit: Payer: Self-pay

## 2020-12-24 ENCOUNTER — Ambulatory Visit: Payer: Commercial Managed Care - PPO | Admitting: Cardiology

## 2020-12-24 VITALS — BP 141/80 | HR 86 | Temp 98.1°F | Resp 16 | Ht 69.0 in | Wt 283.8 lb

## 2020-12-24 DIAGNOSIS — R55 Syncope and collapse: Secondary | ICD-10-CM

## 2020-12-24 DIAGNOSIS — R0602 Shortness of breath: Secondary | ICD-10-CM

## 2020-12-24 DIAGNOSIS — I951 Orthostatic hypotension: Secondary | ICD-10-CM

## 2020-12-24 DIAGNOSIS — R6889 Other general symptoms and signs: Secondary | ICD-10-CM

## 2020-12-24 DIAGNOSIS — E282 Polycystic ovarian syndrome: Secondary | ICD-10-CM

## 2020-12-24 DIAGNOSIS — Z87891 Personal history of nicotine dependence: Secondary | ICD-10-CM

## 2020-12-24 DIAGNOSIS — R9431 Abnormal electrocardiogram [ECG] [EKG]: Secondary | ICD-10-CM

## 2020-12-24 DIAGNOSIS — I10 Essential (primary) hypertension: Secondary | ICD-10-CM

## 2020-12-24 MED ORDER — HYDROCHLOROTHIAZIDE 12.5 MG PO CAPS: 12.5000 mg | ORAL_CAPSULE | Freq: Every morning | ORAL | 0 refills | Status: DC

## 2020-12-24 MED ORDER — LOSARTAN POTASSIUM 50 MG PO TABS: 50.0000 mg | ORAL_TABLET | Freq: Every day | ORAL | 0 refills | Status: DC

## 2020-12-24 MED ORDER — SPIRONOLACTONE 25 MG PO TABS: 25.0000 mg | ORAL_TABLET | Freq: Every morning | ORAL | 0 refills | Status: DC

## 2020-12-24 MED ORDER — LABETALOL HCL 100 MG PO TABS: 100.0000 mg | ORAL_TABLET | Freq: Two times a day (BID) | ORAL | 0 refills | Status: DC

## 2020-12-24 NOTE — Progress Notes (Signed)
Date:  12/24/2020   ID:  Desiree Singh, DOB [birth year], MRN [id number]  PCP:  [facility] @ [place]  Cardiologist:  Rex Kras, DO, [facility] (established care 12/16/2020)  Date: 12/24/20 Last Office Visit: 12/16/2020  Chief Complaint  Patient presents with   Shortness of Breath   Follow-up    1 week    HPI  Desiree Singh is a 40 y.o. female who presents to the office with a chief complaint of " 1 week follow-up for shortness of breath." Patient's past medical history and cardiovascular risk factors include: Hypertension, history of hypothyroidism, depression, GERD, amenorrhea, PCOS, former smoker, obesity due to excess calories.  She is referred to the office at the request of [facility], [facility] for evaluation of Shortness of breath, near-syncope, decreased exercise tolerance.  At the last office visit given her symptoms of shortness of breath that was getting progressively worse, was started on metoprolol 2 weeks prior due to tachycardia, EKG findings of S1 Q3 and inverted T, hormone replacement therapy in the recent past and a long car ride to [place] the concern for pulmonary embolism was relatively high and therefore she was referred to [facility] health ED for evaluation and management of pulmonary embolism.  She had a CT scan which was negative for PE but did reveal patchy left lower lobe groundglass opacity suggestive of either infectious/inflammatory process.  She is aware of these findings but has not followed up with PCP yet.  Patient states that she continues to be short of breath predominantly with effort related activities, blood pressures at home range between 140-150 mmHg.  And at night when she wakes up to go to the restroom she does feel lightheaded and dizzy with changing positions.  Patient denies any precordial pain or anginal equivalent symptoms.  FUNCTIONAL STATUS: 3 months ago she used to work out 2 or 3 times a week for 60 minutes at a  time which included cardio, HIT training, interval training.  ALLERGIES: Allergies  Allergen Reactions   Flexeril [Cyclobenzaprine] Other (See Comments)    Headaches, nausea, vomiting    MEDICATION LIST PRIOR TO VISIT: Current Meds  Medication Sig   hydrochlorothiazide (MICROZIDE) 12.5 MG capsule Take 1 capsule (12.5 mg total) by mouth every morning.   labetalol (NORMODYNE) 100 MG tablet Take 1 tablet (100 mg total) by mouth 2 (two) times daily.   Multiple Vitamin (MULTIVITAMIN WITH MINERALS) TABS tablet Take 1 tablet by mouth daily.   pantoprazole (PROTONIX) 40 MG tablet Take 40 mg by mouth daily.   [DISCONTINUED] lisinopril-hydrochlorothiazide (ZESTORETIC) 20-25 MG tablet Take 1 tablet by mouth daily.   [DISCONTINUED] losartan (COZAAR) 50 MG tablet Take 1 tablet (50 mg total) by mouth daily at 10 pm.   [DISCONTINUED] metoprolol succinate (TOPROL-XL) 25 MG 24 hr tablet Take 25 mg by mouth daily.   [DISCONTINUED] spironolactone (ALDACTONE) 25 MG tablet Take 1 tablet (25 mg total) by mouth every morning.     PAST MEDICAL HISTORY: Past Medical History:  Diagnosis Date   Anxiety    Asthma    Headache    History of PCOS    Hypertension    Hypothyroidism    2009-2012    PAST SURGICAL HISTORY: Past Surgical History:  Procedure Laterality Date   CESAREAN SECTION N/A 08/04/2017   Procedure: CESAREAN SECTION;  Surgeon: Everlene Farrier, MD;  Location: [facility];  Service: Obstetrics;  Laterality: N/A;   TYMPANOSTOMY TUBE PLACEMENT      FAMILY HISTORY: The  patient family history includes Colon cancer in her maternal grandfather; Diabetes in her maternal aunt; Hypertension in her maternal aunt, maternal grandmother, and mother. No family history of premature coronary disease or sudden cardiac death.  SOCIAL HISTORY:  The patient  reports that she has never smoked. She has never used smokeless tobacco. She reports that she does not drink alcohol and does not use  drugs.  REVIEW OF SYSTEMS: Review of Systems  Constitutional: Negative for chills and fever.  HENT:  Negative for hoarse voice and nosebleeds.   Eyes:  Negative for discharge, double vision and pain.  Cardiovascular:  Positive for dyspnea on exertion and near-syncope. Negative for chest pain, claudication, leg swelling, orthopnea, palpitations, paroxysmal nocturnal dyspnea and syncope.  Respiratory:  Positive for shortness of breath. Negative for hemoptysis.   Musculoskeletal:  Negative for muscle cramps and myalgias.  Gastrointestinal:  Negative for abdominal pain, constipation, diarrhea, hematemesis, hematochezia, melena, nausea and vomiting.  Neurological:  Positive for dizziness and light-headedness.   PHYSICAL EXAM: Vitals with BMI 12/24/2020 12/16/2020 12/16/2020  Height 5' 9"  - -  Weight 283 lbs 13 oz - -  BMI 54.62 - -  Systolic 703 500 938  Diastolic 80 77 69  Pulse 86 92 72   Orthostatic VS for the past 72 hrs (Last 3 readings):  Orthostatic BP Patient Position BP Location Cuff Size Orthostatic Pulse  12/24/20 1645 103/42 Standing Left Arm Large 91  12/24/20 1644 117/89 Sitting Left Arm Large 90  12/24/20 1643 120/63 Supine Left Arm Large 88     CONSTITUTIONAL: Age-appropriate female, hemodynamically stable, well-developed and well-nourished. No acute distress.  SKIN: Skin is warm and dry. No rash noted. No cyanosis. No pallor. No jaundice HEAD: Normocephalic and atraumatic.  EYES: No scleral icterus MOUTH/THROAT: Moist oral membranes.  NECK: No JVD present. No thyromegaly noted. No carotid bruits.  Findings suggestive of goiter LYMPHATIC: No visible cervical adenopathy.  CHEST Normal respiratory effort. No intercostal retractions  LUNGS: Clear to auscultation bilaterally. no stridor. No wheezes. No rales.  CARDIOVASCULAR: Regular rate and rhythm, positive S1-S2, no murmurs rubs or gallops appreciated. ABDOMINAL: Obese, soft, nontender, nondistended, positive bowel  sounds in all 4 quadrants no apparent ascites.  EXTREMITIES: No peripheral edema, varicose veins present bilaterally, warm to touch bilaterally, equal circumference of bilateral lower extremities visually. HEMATOLOGIC: No significant bruising NEUROLOGIC: Oriented to person, place, and time. Nonfocal. Normal muscle tone.  PSYCHIATRIC: Normal mood and affect. Normal behavior. Cooperative  CARDIAC DATABASE: EKG: 12/16/2020: Normal sinus rhythm, 76 bpm, left atrial enlargement, LAE, without underlying ischemia, (S1, Q3, inverted T lead 3) consider pulmonary embolism evaluation if clinically appropriate.  Echocardiogram: No results found for this or any previous visit from the past 1095 days.   Stress Testing: No results found for this or any previous visit from the past 1095 days.  Heart Catheterization: None  LABORATORY DATA: CBC Latest Ref Rng & Units 12/16/2020 03/05/2020 12/16/2019  WBC 4.0 - 10.5 K/uL 9.3 7.4 8.8  Hemoglobin 12.0 - 15.0 g/dL 13.5 12.9 13.3  Hematocrit 36.0 - 46.0 % 41.1 39.8 40.0  Platelets 150 - 400 K/uL 217 186 178    CMP Latest Ref Rng & Units 12/16/2020 12/16/2019 11/11/2019  Glucose 70 - 99 mg/dL 98 107(H) 122(H)  BUN 6 - 20 mg/dL 12 14 14   Creatinine 0.44 - 1.00 mg/dL 0.82 0.69 0.77  Sodium 135 - 145 mmol/L 133(L) 135 138  Potassium 3.5 - 5.1 mmol/L 3.5 3.8 4.6  Chloride 98 -  111 mmol/L 98 102 107  CO2 22 - 32 mmol/L 25 22 22   Calcium 8.9 - 10.3 mg/dL 9.4 8.4(L) 9.0  Total Protein 6.5 - 8.1 g/dL - 7.1 7.6  Total Bilirubin 0.3 - 1.2 mg/dL - 0.7 0.6  Alkaline Phos 38 - 126 U/L - 55 57  AST 15 - 41 U/L - 18 25  ALT 0 - 44 U/L - 30 24    Lipid Panel  No results found for: CHOL, TRIG, HDL, CHOLHDL, VLDL, LDLCALC, LDLDIRECT, LABVLDL  No components found for: NTPROBNP No results for input(s): PROBNP in the last 8760 hours. No results for input(s): TSH in the last 8760 hours.  BMP Recent Labs    12/16/20 1239  NA 133*  K 3.5  CL 98  CO2 25  GLUCOSE 98   BUN 12  CREATININE 0.82  CALCIUM 9.4  GFRNONAA >60    HEMOGLOBIN A1C No results found for: HGBA1C, MPG  External Labs: Collected: 09/30/2020 Hemoglobin 13.8 g/dL, hematocrit 41.7% Creatinine 0.75 mg/dL. eGFR: >60 mL/min per 1.73 m Hemoglobin A1c: 5.5 TSH: 2.2  IMPRESSION:    ICD-10-CM   1. Shortness of breath  R06.02 TSH    PCV ECHOCARDIOGRAM COMPLETE    labetalol (NORMODYNE) 100 MG tablet    hydrochlorothiazide (MICROZIDE) 12.5 MG capsule    DISCONTINUED: losartan (COZAAR) 50 MG tablet    DISCONTINUED: spironolactone (ALDACTONE) 25 MG tablet    2. Decreased exercise tolerance  R68.89     3. Orthostatic hypotension  I95.1     4. Benign hypertension  G50 Basic metabolic panel    Magnesium    labetalol (NORMODYNE) 100 MG tablet    hydrochlorothiazide (MICROZIDE) 12.5 MG capsule    DISCONTINUED: losartan (COZAAR) 50 MG tablet    DISCONTINUED: spironolactone (ALDACTONE) 25 MG tablet    5. PCOS (polycystic ovarian syndrome)  E28.2 DISCONTINUED: spironolactone (ALDACTONE) 25 MG tablet    6. Former smoker  Z87.891     22. Class 3 severe obesity due to excess calories without serious comorbidity with body mass index (BMI) of 40.0 to 44.9 in adult [facility])  E66.01    Z68.41        RECOMMENDATIONS: Desiree Singh is a 40 y.o. female whose past medical history and cardiac risk factors include: Hypertension, history of hypothyroidism, depression, GERD, amenorrhea, PCOS, former smoker, obesity due to excess calories.  Shortness of breath: CT PE protocol negative for pulmonary embolism Recommend echocardiogram to evaluate for structural heart disease, diastolic dysfunction and valvular heart disease. Patient is asked to consider pulmonary evaluation as she has had pneumonia followed by COVID followed by pneumonia and given the current CT findings.  Will defer to primary team at this time Recommend better blood pressure management, see below Check TSH-given her symptoms and  physical examination findings We did discuss an ischemic evaluation at today's visit, shared decision was to hold off proceeding forward until the next visit.  Benign essential hypertension: Patient was orthostatic at the last office visit but orthostatic vital signs were negative today. Initially recommended spironolactone secondary to her diagnosis of PCOS.  However not the ideal drug of choice as they are trying to get pregnant.  Also will discontinue ACE inhibitor/ARB for similar reasons. HCTZ 12.5 mg p.o. every morning Labetalol 100 mg p.o. twice daily Blood work in 1 week to evaluate kidney function and electrolytes Low-salt diet recommended She is asked to keep a log of her blood pressures and to bring it in at the next office  visit.  Orthostatic hypotension: She was orthostatic at the last office visit. Orthostatic vital signs within normal limits at today's office encounter.  FINAL MEDICATION LIST END OF ENCOUNTER: Meds ordered this encounter  Medications   DISCONTD: losartan (COZAAR) 50 MG tablet    Sig: Take 1 tablet (50 mg total) by mouth daily at 10 pm.    Dispense:  90 tablet    Refill:  0   DISCONTD: spironolactone (ALDACTONE) 25 MG tablet    Sig: Take 1 tablet (25 mg total) by mouth every morning.    Dispense:  90 tablet    Refill:  0   labetalol (NORMODYNE) 100 MG tablet    Sig: Take 1 tablet (100 mg total) by mouth 2 (two) times daily.    Dispense:  60 tablet    Refill:  0   hydrochlorothiazide (MICROZIDE) 12.5 MG capsule    Sig: Take 1 capsule (12.5 mg total) by mouth every morning.    Dispense:  30 capsule    Refill:  0     Medications Discontinued During This Encounter  Medication Reason   lisinopril-hydrochlorothiazide (ZESTORETIC) 20-25 MG tablet Discontinued by provider   spironolactone (ALDACTONE) 25 MG tablet Discontinued by provider   losartan (COZAAR) 50 MG tablet Discontinued by provider   metoprolol succinate (TOPROL-XL) 25 MG 24 hr tablet  Discontinued by provider     Current Outpatient Medications:    hydrochlorothiazide (MICROZIDE) 12.5 MG capsule, Take 1 capsule (12.5 mg total) by mouth every morning., Disp: 30 capsule, Rfl: 0   labetalol (NORMODYNE) 100 MG tablet, Take 1 tablet (100 mg total) by mouth 2 (two) times daily., Disp: 60 tablet, Rfl: 0   Multiple Vitamin (MULTIVITAMIN WITH MINERALS) TABS tablet, Take 1 tablet by mouth daily., Disp: , Rfl:    pantoprazole (PROTONIX) 40 MG tablet, Take 40 mg by mouth daily., Disp: , Rfl:   Orders Placed This Encounter  Procedures   Basic metabolic panel   Magnesium   TSH   PCV ECHOCARDIOGRAM COMPLETE    There are no Patient Instructions on file for this visit.   --Continue cardiac medications as reconciled in final medication list. --Return in about 4 weeks (around 01/21/2021) for Follow up, BP, Dyspnea. Or sooner if needed. --Continue follow-up with your primary care physician regarding the management of your other chronic comorbid conditions.  Patient's questions and concerns were addressed to her satisfaction. She voices understanding of the instructions provided during this encounter.   This note was created using a voice recognition software as a result there may be grammatical errors inadvertently enclosed that do not reflect the nature of this encounter. Every attempt is made to correct such errors.  Rex Kras, [place], [facility]  Pager: [phone] Office: [phone]

## 2020-12-28 ENCOUNTER — Other Ambulatory Visit: Payer: Self-pay

## 2020-12-28 DIAGNOSIS — I10 Essential (primary) hypertension: Secondary | ICD-10-CM

## 2020-12-31 ENCOUNTER — Other Ambulatory Visit: Payer: Self-pay

## 2020-12-31 ENCOUNTER — Ambulatory Visit: Payer: Commercial Managed Care - PPO

## 2020-12-31 DIAGNOSIS — R0602 Shortness of breath: Secondary | ICD-10-CM

## 2021-01-06 ENCOUNTER — Other Ambulatory Visit: Payer: Self-pay | Admitting: Cardiology

## 2021-01-06 DIAGNOSIS — R0602 Shortness of breath: Secondary | ICD-10-CM

## 2021-01-15 ENCOUNTER — Other Ambulatory Visit: Payer: Self-pay | Admitting: Cardiology

## 2021-01-15 DIAGNOSIS — R0602 Shortness of breath: Secondary | ICD-10-CM

## 2021-01-15 DIAGNOSIS — I10 Essential (primary) hypertension: Secondary | ICD-10-CM

## 2021-01-21 ENCOUNTER — Ambulatory Visit: Payer: Commercial Managed Care - PPO | Admitting: Cardiology

## 2021-02-03 ENCOUNTER — Ambulatory Visit: Payer: Commercial Managed Care - PPO | Admitting: Cardiology

## 2021-02-24 ENCOUNTER — Other Ambulatory Visit: Payer: Self-pay | Admitting: Cardiology

## 2021-02-24 DIAGNOSIS — R0602 Shortness of breath: Secondary | ICD-10-CM

## 2021-02-24 DIAGNOSIS — I10 Essential (primary) hypertension: Secondary | ICD-10-CM

## 2021-03-24 ENCOUNTER — Ambulatory Visit: Payer: Commercial Managed Care - PPO | Admitting: Cardiology

## 2021-03-25 ENCOUNTER — Ambulatory Visit: Payer: Commercial Managed Care - PPO | Admitting: Cardiology

## 2021-04-16 ENCOUNTER — Ambulatory Visit: Payer: Commercial Managed Care - PPO

## 2021-04-16 ENCOUNTER — Other Ambulatory Visit: Payer: Self-pay

## 2021-04-16 DIAGNOSIS — R0602 Shortness of breath: Secondary | ICD-10-CM

## 2021-04-23 ENCOUNTER — Ambulatory Visit: Payer: Commercial Managed Care - PPO | Admitting: Cardiology

## 2021-04-23 ENCOUNTER — Encounter: Payer: Self-pay | Admitting: Cardiology

## 2021-04-23 ENCOUNTER — Other Ambulatory Visit: Payer: Self-pay

## 2021-04-23 VITALS — BP 134/85 | HR 75 | Resp 16 | Ht 69.0 in | Wt 289.0 lb

## 2021-04-23 DIAGNOSIS — E282 Polycystic ovarian syndrome: Secondary | ICD-10-CM

## 2021-04-23 DIAGNOSIS — R0602 Shortness of breath: Secondary | ICD-10-CM

## 2021-04-23 DIAGNOSIS — Z87891 Personal history of nicotine dependence: Secondary | ICD-10-CM

## 2021-04-23 DIAGNOSIS — I10 Essential (primary) hypertension: Secondary | ICD-10-CM

## 2021-04-23 NOTE — Progress Notes (Signed)
Date:  04/23/2021   ID:  Desiree Singh, DOB 12/23/1980, MRN [id number]  PCP:  Desiree Singh  Cardiologist:  Desiree Kras, DO, [facility] (established care 12/16/2020)  Date: 04/23/21 Last Office Visit: 12/24/2020  Chief Complaint  Patient presents with   Shortness of Breath   Follow-up    HPI  Desiree Singh is a 40 y.o. female who presents to the office with a chief complaint of " 49-monthfollow-up to reevaluate shortness of breath and review test results." Patient's past medical history and cardiovascular risk factors include: Hypertension, history of hypothyroidism, depression, GERD, amenorrhea, PCOS, former smoker, obesity due to excess calories.  She is [facility] to the [facility] of Shortness of breath, near-syncope, decreased exercise tolerance.  She is accompanied by her husband at today's office visit.  During my initial encounters with the patient due to progressive shortness of breath, tachycardia despite beta-blocker therapy, EKG findings, being on hormone replacement therapy, and recent prolonged car patient was requested to go to ED to be evaluated for pulmonary embolism, CT PE protocol was negative for PE.  After that the shared decision was to uptitrate antihypertensive medications and undergo ischemic evaluation.  Patient's blood pressure medication have been uptitrated and her home blood pressures are now very well controlled.  Patient denies shortness of breath at rest.  He does experience with over exertional activities.  She underwent echocardiogram which notes preserved LVEF, normal diastolic function, no pulmonary hypertension.  In addition, stress test was overall low risk study.  Patient denies any precordial pain or anginal equivalent symptoms.  FUNCTIONAL STATUS: 3 months ago she used to work out 2 or 3 times a week for 60 minutes at a time which included cardio, HIT training, interval  training.  ALLERGIES: Allergies  Allergen Reactions   Flexeril [Cyclobenzaprine] Other (See Comments)    Headaches, nausea, vomiting    MEDICATION LIST PRIOR TO VISIT: Current Meds  Medication Sig   famotidine (PEPCID) 20 MG tablet Take 20 mg by mouth 2 (two) times daily.   labetalol (NORMODYNE) 100 MG tablet TAKE 1 TABLET BY MOUTH TWICE A DAY   meloxicam (MOBIC) 15 MG tablet Take 15 mg by mouth daily.   Multiple Vitamin (MULTIVITAMIN WITH MINERALS) TABS tablet Take 1 tablet by mouth daily.   pantoprazole (PROTONIX) 40 MG tablet Take 40 mg by mouth daily.     PAST MEDICAL HISTORY: Past Medical History:  Diagnosis Date   Anxiety    Asthma    Headache    History of PCOS    Hypertension    Hypothyroidism    2009-2012    PAST SURGICAL HISTORY: Past Surgical History:  Procedure Laterality Date   CESAREAN SECTION N/A 08/04/2017   Procedure: CESAREAN SECTION;  Surgeon: TEverlene Farrier MD;  [place]  Service: Obstetrics;  Laterality: N/A;   TYMPANOSTOMY TUBE PLACEMENT      FAMILY HISTORY: The patient family history includes Colon cancer in her maternal grandfather; Diabetes in her maternal aunt; Hypertension in her maternal aunt, maternal grandmother, and mother. No family history of premature coronary disease or sudden cardiac death.  SOCIAL HISTORY:  The patient  reports that she has never smoked. She has never used smokeless tobacco. She reports that she does not drink alcohol and does not use drugs.  REVIEW OF SYSTEMS: Review of Systems  Constitutional: Negative for chills and fever.  HENT:  Negative for hoarse voice and  nosebleeds.   Eyes:  Negative for discharge, double vision and pain.  Cardiovascular:  Positive for dyspnea on exertion (improving). Negative for chest pain, claudication, leg swelling, near-syncope, orthopnea, palpitations, paroxysmal nocturnal dyspnea and syncope.  Respiratory:  Negative for hemoptysis and shortness of breath.    Musculoskeletal:  Negative for muscle cramps and myalgias.  Gastrointestinal:  Negative for abdominal pain, constipation, diarrhea, hematemesis, hematochezia, melena, nausea and vomiting.  Neurological:  Negative for dizziness and light-headedness.   PHYSICAL EXAM: Vitals with BMI 04/23/2021 12/24/2020 12/16/2020  Height _0  _1  -  Weight 289 lbs 283 lbs 13 oz -  BMI 82.95 62.13 -  Systolic 086 578 469  Diastolic 85 80 77  Pulse 75 86 92    CONSTITUTIONAL: Age-appropriate female, hemodynamically stable, well-developed and well-nourished. No acute distress.  SKIN: Skin is warm and dry. No rash noted. No cyanosis. No pallor. No jaundice HEAD: Normocephalic and atraumatic.  EYES: No scleral icterus MOUTH/THROAT: Moist oral membranes.  NECK: No JVD present. No thyromegaly noted. No carotid bruits.  Findings suggestive of goiter LYMPHATIC: No visible cervical adenopathy.  CHEST Normal respiratory effort. No intercostal retractions  LUNGS: Clear to auscultation bilaterally. no stridor. No wheezes. No rales.  CARDIOVASCULAR: Regular rate and rhythm, positive S1-S2, no murmurs rubs or gallops appreciated. ABDOMINAL: Obese, soft, nontender, nondistended, positive bowel sounds in all 4 quadrants no apparent ascites.  EXTREMITIES: No peripheral edema, varicose veins present bilaterally, warm to touch bilaterally, equal circumference of bilateral lower extremities visually. HEMATOLOGIC: No significant bruising NEUROLOGIC: Oriented to person, place, and time. Nonfocal. Normal muscle tone.  PSYCHIATRIC: Normal mood and affect. Normal behavior. Cooperative  CARDIAC DATABASE: EKG: 12/16/2020: Normal sinus rhythm, 76 bpm, left atrial enlargement, LAE, without underlying ischemia, (S1, Q3, inverted T lead 3) consider pulmonary embolism evaluation if clinically appropriate.  Echocardiogram: 12/31/2020:  Left ventricle cavity is normal in size and wall thickness. Normal global wall motion. Normal  LV systolic function with EF 65%. Normal diastolic filling pattern.  No significant valvular abnormality.  No evidence of pulmonary hypertension.   Stress Testing: Exercise treadmill stress test 04/16/2021: Functional status: Fair. Chest pain: No. Reason for stopping exercise: Fatigue/weakness. Hypertensive response to exercise: No. Exercise time 7 minutes 30 seconds on Bruce protocol, achieved 9.38 METS, 90% of APMHR.  Stress ECG negative for ischemia.   Low risk study.   Heart Catheterization: None  LABORATORY DATA: CBC Latest Ref Rng & Units 12/16/2020 03/05/2020 12/16/2019  WBC 4.0 - 10.5 K/uL 9.3 7.4 8.8  Hemoglobin 12.0 - 15.0 g/dL 13.5 12.9 13.3  Hematocrit 36.0 - 46.0 % 41.1 39.8 40.0  Platelets 150 - 400 K/uL 217 186 178    CMP Latest Ref Rng & Units 12/16/2020 12/16/2019 11/11/2019  Glucose 70 - 99 mg/dL 98 107(H) 122(H)  BUN 6 - 20 mg/dL _2 Creatinine 0.44 - 1.00 mg/dL 0.82 0.69 0.77  Sodium 135 - 145 mmol/L 133(L) 135 138  Potassium 3.5 - 5.1 mmol/L 3.5 3.8 4.6  Chloride 98 - 111 mmol/L 98 102 107  CO2 22 - 32 mmol/L _3 Calcium 8.9 - 10.3 mg/dL 9.4 8.4(L) 9.0  Total Protein 6.5 - 8.1 g/dL - 7.1 7.6  Total Bilirubin 0.3 - 1.2 mg/dL - 0.7 0.6  Alkaline Phos 38 - 126 U/L - 55 57  AST 15 - 41 U/L - 18 25  ALT 0 - 44 U/L - 30 24    Lipid Panel  No results found for:  CHOL, TRIG, HDL, CHOLHDL, VLDL, LDLCALC, LDLDIRECT, LABVLDL  No components found for: NTPROBNP No results for input(s): PROBNP in the last 8760 hours. No results for input(s): TSH in the last 8760 hours.  BMP Recent Labs    12/16/20 1239  NA 133*  K 3.5  CL 98  CO2 25  GLUCOSE 98  BUN 12  CREATININE 0.82  CALCIUM 9.4  GFRNONAA >60    HEMOGLOBIN A1C No results found for: HGBA1C, MPG  External Labs: Collected: 09/30/2020 Hemoglobin 13.8 g/dL, hematocrit 41.7% Creatinine 0.75 mg/dL. eGFR: >60 mL/min per 1.73 m Hemoglobin A1c: 5.5 TSH: 2.2  IMPRESSION:    ICD-10-CM    1. Shortness of breath  R06.02     2. Benign hypertension  I10     3. PCOS (polycystic ovarian syndrome)  E28.2     4. Former smoker  Z87.891     5. Class 3 severe obesity due to excess calories without serious comorbidity with body mass index (BMI) of 40.0 to 44.9 in adult [facility])  E66.01    Z68.41        RECOMMENDATIONS: Desiree Singh is a 40 y.o. female whose past medical history and cardiac risk factors include: Hypertension, history of hypothyroidism, depression, GERD, amenorrhea, PCOS, former smoker, obesity due to excess calories.  Shortness of breath: CT PE protocol negative for pulmonary embolism Echo: Preserved LVEF, normal diastolic function, no significant valvular heart disease.   GXT: Low risk study  Shortness of breath has improved significantly since last office visit.  Now presents with effort related activities and I suspect is most likely due to deconditioning.   Blood pressures have also improved since establishing care. Would recommend continuing to monitor her symptoms for now.   No additional cardiovascular testing warranted at this time.    Benign essential hypertension: Patient's medications have been recently titrated as they are planning to get pregnant.   Continue labetalol.   She is aware to have her medications reviewed if and when she gets pregnant to make sure they are safe for pregnancy.  P Low-salt diet recommended She is asked to keep a log of her blood pressures and to bring it in at the next office visit.  Former smoker: Educated on the importance of continued smoking cessation.  FINAL MEDICATION LIST END OF ENCOUNTER: No orders of the defined types were placed in this encounter.    Medications Discontinued During This Encounter  Medication Reason   hydrochlorothiazide (MICROZIDE) 12.5 MG capsule      Current Outpatient Medications:    famotidine (PEPCID) 20 MG tablet, Take 20 mg by mouth 2 (two) times daily., Disp: , Rfl:     labetalol (NORMODYNE) 100 MG tablet, TAKE 1 TABLET BY MOUTH TWICE A DAY, Disp: 180 tablet, Rfl: 0   meloxicam (MOBIC) 15 MG tablet, Take 15 mg by mouth daily., Disp: , Rfl:    Multiple Vitamin (MULTIVITAMIN WITH MINERALS) TABS tablet, Take 1 tablet by mouth daily., Disp: , Rfl:    pantoprazole (PROTONIX) 40 MG tablet, Take 40 mg by mouth daily., Disp: , Rfl:   No orders of the defined types were placed in this encounter.   There are no Patient Instructions on file for this visit.   --Continue cardiac medications as reconciled in final medication list. --Return in about 1 year (around 04/23/2022) for Follow up, Dyspnea. Or sooner if needed. --Continue follow-up with your primary care physician regarding the management of your other chronic comorbid conditions.  Patient's questions and concerns  were addressed to her satisfaction. She voices understanding of the instructions provided during this encounter.   This note was created using a voice recognition software as a result there may be grammatical errors inadvertently enclosed that do not reflect the nature of this encounter. Every attempt is made to correct such errors.  Desiree Singh, [place], [facility]  Pager: [phone] Office: [phone]

## 2021-06-02 ENCOUNTER — Other Ambulatory Visit: Payer: Self-pay | Admitting: Cardiology

## 2021-06-02 DIAGNOSIS — I10 Essential (primary) hypertension: Secondary | ICD-10-CM

## 2021-06-02 DIAGNOSIS — R0602 Shortness of breath: Secondary | ICD-10-CM

## 2021-08-29 ENCOUNTER — Other Ambulatory Visit: Payer: Self-pay | Admitting: Cardiology

## 2021-08-29 DIAGNOSIS — I10 Essential (primary) hypertension: Secondary | ICD-10-CM

## 2021-08-29 DIAGNOSIS — R0602 Shortness of breath: Secondary | ICD-10-CM

## 2021-10-01 IMAGING — US US ABDOMEN LIMITED
1 series · 14 of 25 positions shown · non-contrast
Comparison: 11/11/2019

CLINICAL DATA: Right upper quadrant pain and bloating

EXAM:
ULTRASOUND ABDOMEN LIMITED RIGHT UPPER QUADRANT

[Series 1: us abdomen limited · 0.23mm/px · 14 of 38 slices shown]
[im 1/38]
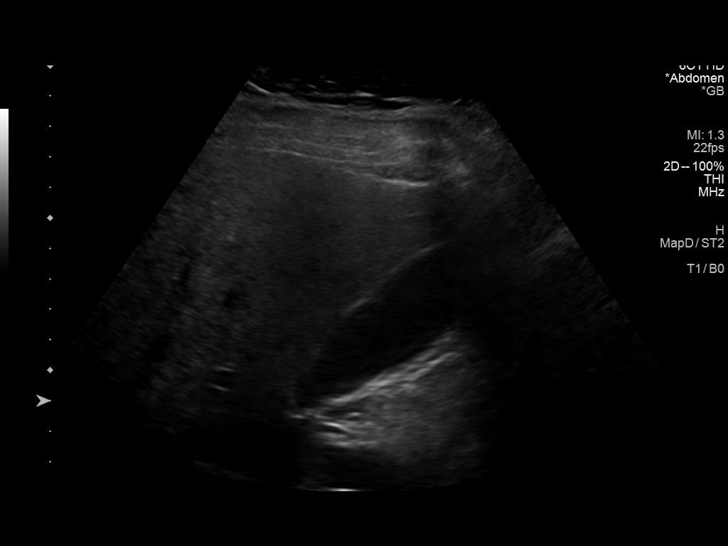
[im 4/38]
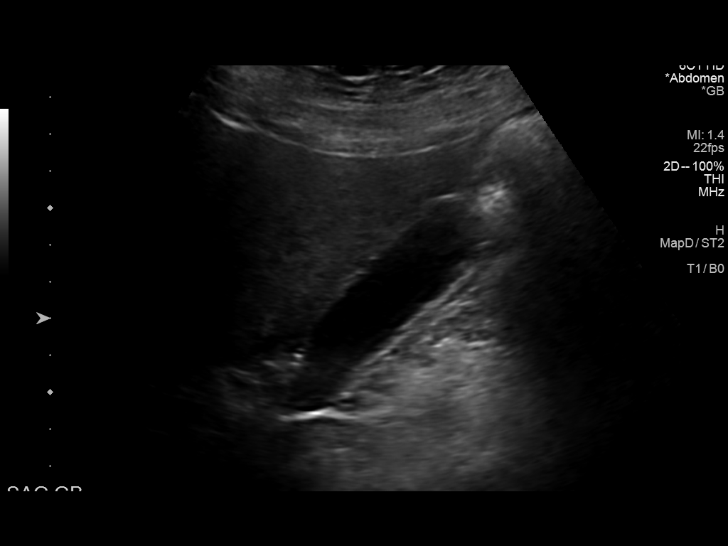
[im 7/38]
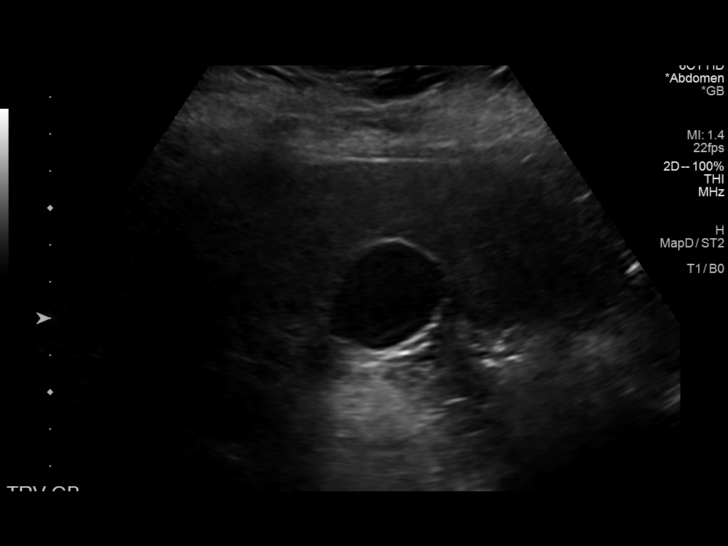
[im 10/38]
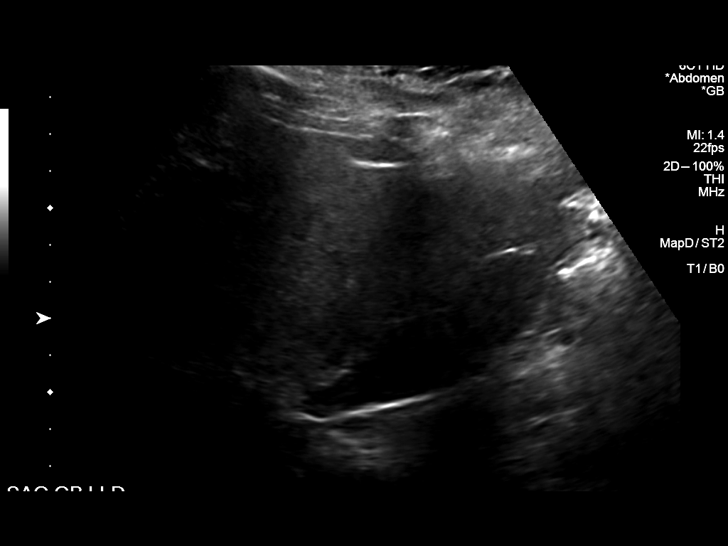
[im 13/38]
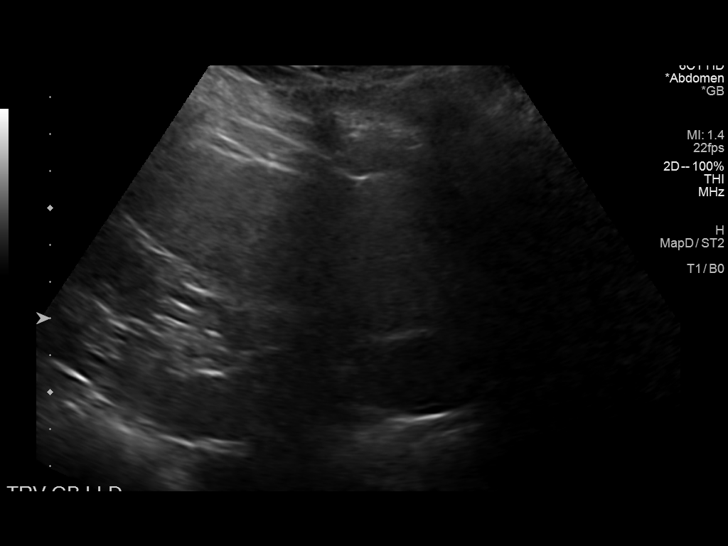
[im 14/38]
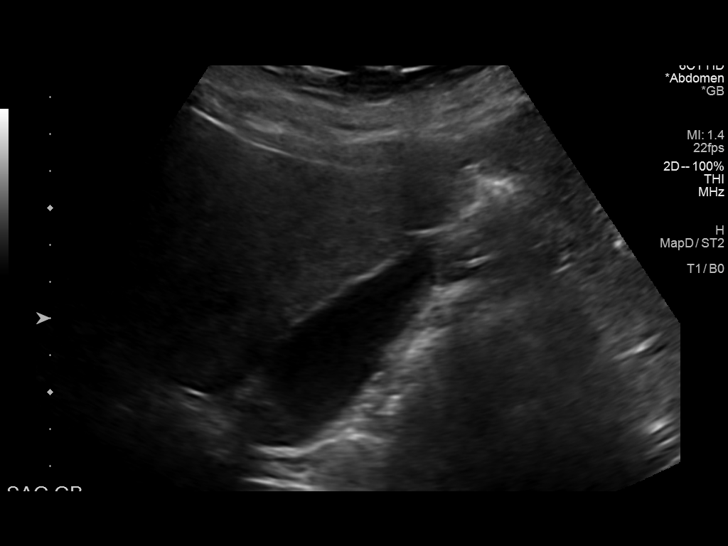
[im 17/38]
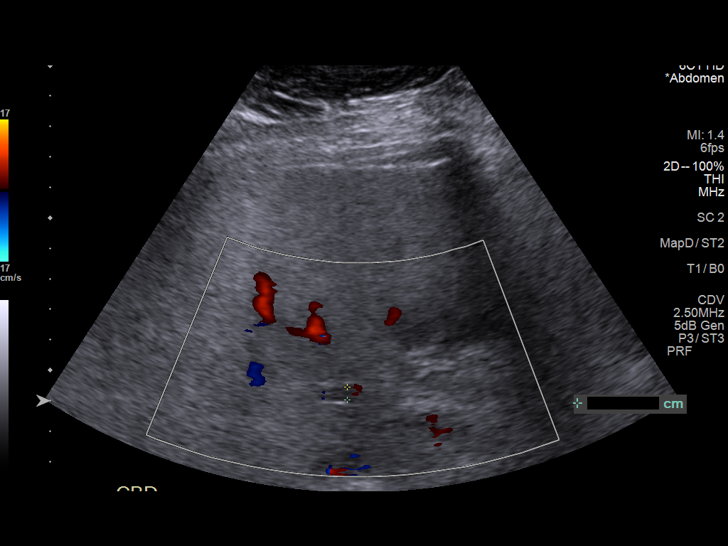
[im 21/38]
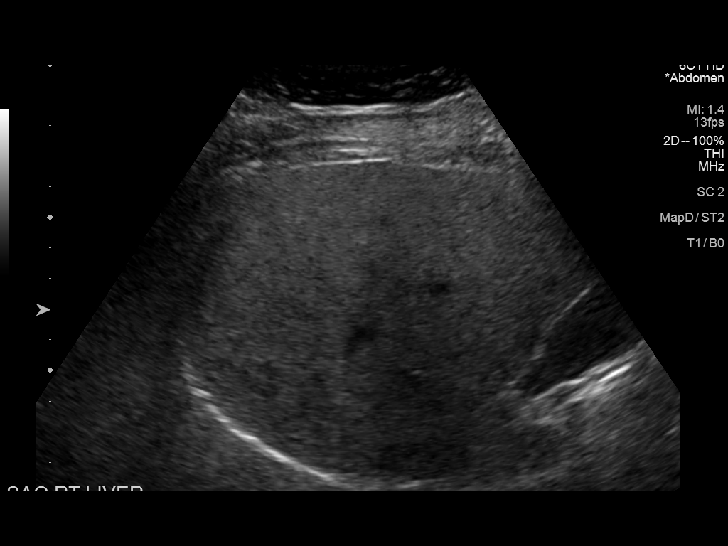
[im 24/38]
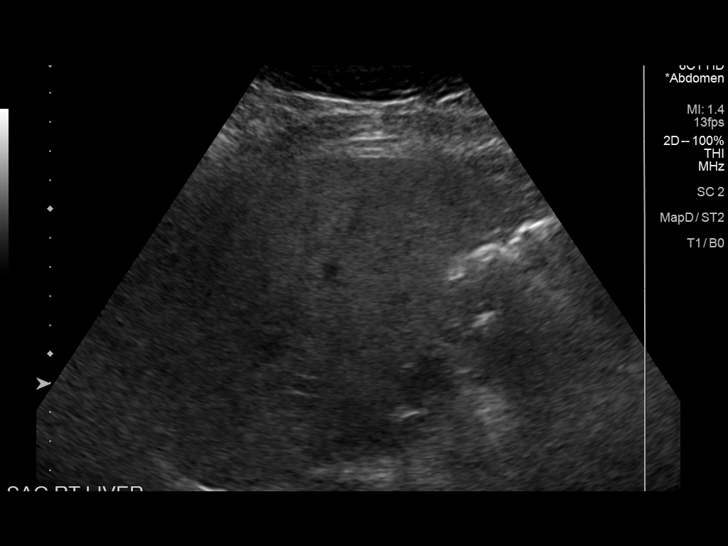
[im 25/38]
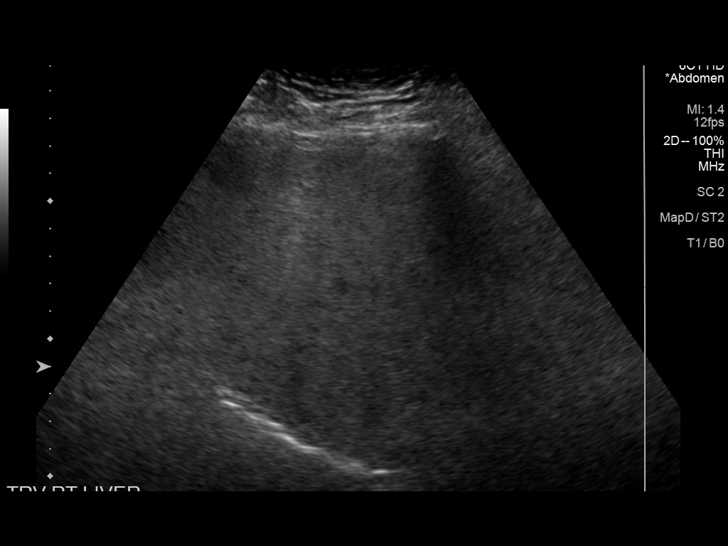
[im 28/38]
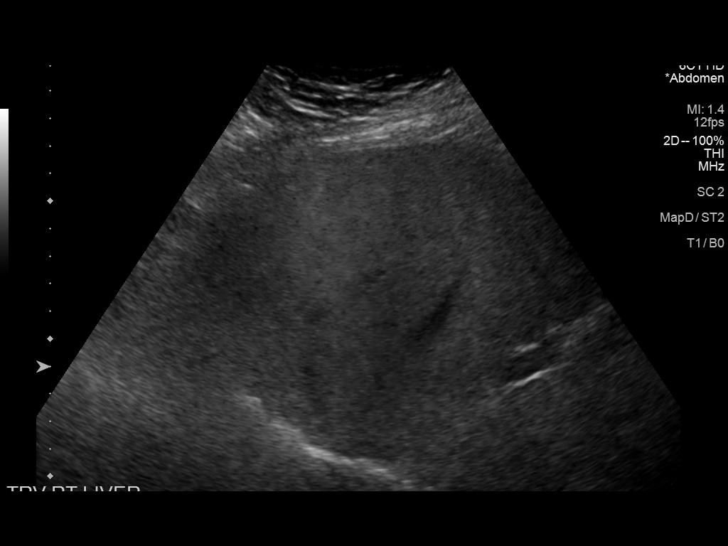
[im 31/38]
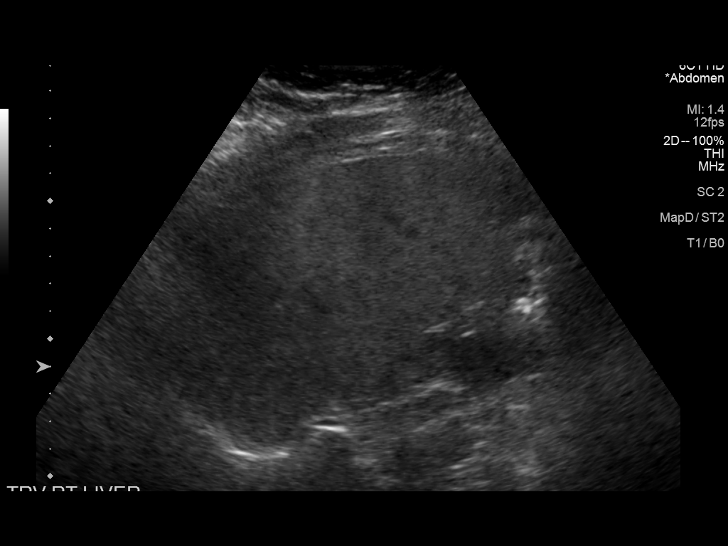
[im 34/38]
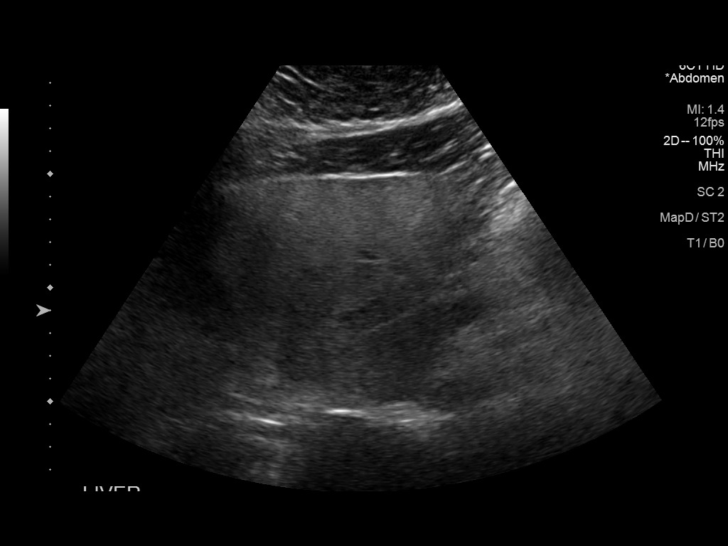
[im 38/38]
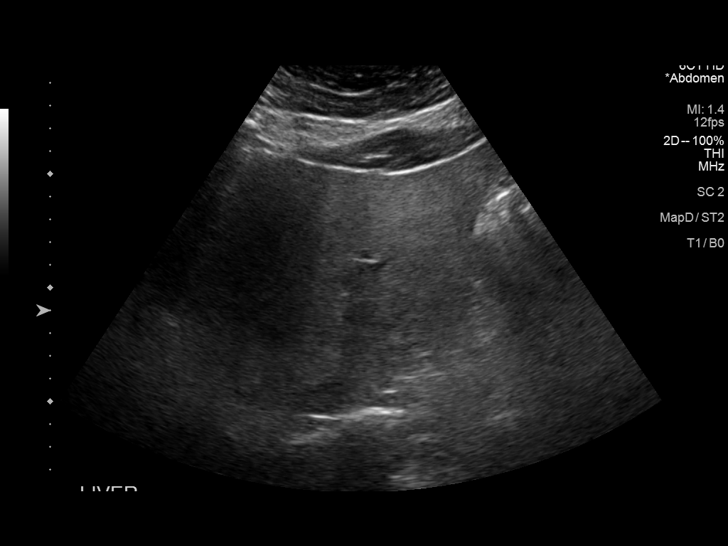

[14 of 25 positions shown; findings below may reference images not displayed]

FINDINGS: Gallbladder:

No gallstones or wall thickening visualized. No sonographic Murphy
sign noted by sonographer.

Common bile duct:

Diameter: 4.2 mm.

Liver:

Increased in echogenicity with heterogeneity consistent with fatty
infiltration. The overall appearance is similar to that seen on the
prior exam. No mass lesion is noted. Portal vein is patent on color
Doppler imaging with normal direction of blood flow towards the
liver.

Other: None.
IMPRESSION: Fatty liver.

No other focal abnormality is noted.

## 2021-12-03 ENCOUNTER — Other Ambulatory Visit: Payer: Self-pay | Admitting: Cardiology

## 2021-12-03 DIAGNOSIS — R0602 Shortness of breath: Secondary | ICD-10-CM

## 2021-12-03 DIAGNOSIS — I10 Essential (primary) hypertension: Secondary | ICD-10-CM

## 2021-12-10 ENCOUNTER — Inpatient Hospital Stay (HOSPITAL_COMMUNITY)
Admission: AD | Admit: 2021-12-10 | Discharge: 2021-12-11 | Disposition: A | Discharge status: Home / Self Care | Payer: Commercial Managed Care - PPO | Attending: Obstetrics & Gynecology | Admitting: Obstetrics & Gynecology

## 2021-12-10 ENCOUNTER — Encounter (HOSPITAL_COMMUNITY): Payer: Self-pay | Admitting: Obstetrics & Gynecology

## 2021-12-10 ENCOUNTER — Other Ambulatory Visit: Payer: Self-pay

## 2021-12-10 ENCOUNTER — Telehealth: Payer: Self-pay

## 2021-12-10 ENCOUNTER — Inpatient Hospital Stay (HOSPITAL_COMMUNITY): Payer: Commercial Managed Care - PPO

## 2021-12-10 DIAGNOSIS — Z6790 Unspecified blood type, Rh positive: Secondary | ICD-10-CM | POA: Diagnosis not present

## 2021-12-10 DIAGNOSIS — Z3490 Encounter for supervision of normal pregnancy, unspecified, unspecified trimester: Secondary | ICD-10-CM

## 2021-12-10 DIAGNOSIS — R300 Dysuria: Secondary | ICD-10-CM | POA: Insufficient documentation

## 2021-12-10 DIAGNOSIS — O26891 Other specified pregnancy related conditions, first trimester: Secondary | ICD-10-CM | POA: Insufficient documentation

## 2021-12-10 DIAGNOSIS — N390 Urinary tract infection, site not specified: Secondary | ICD-10-CM | POA: Diagnosis not present

## 2021-12-10 DIAGNOSIS — O209 Hemorrhage in early pregnancy, unspecified: Secondary | ICD-10-CM | POA: Diagnosis not present

## 2021-12-10 DIAGNOSIS — O2341 Unspecified infection of urinary tract in pregnancy, first trimester: Secondary | ICD-10-CM | POA: Diagnosis not present

## 2021-12-10 DIAGNOSIS — R109 Unspecified abdominal pain: Secondary | ICD-10-CM | POA: Insufficient documentation

## 2021-12-10 DIAGNOSIS — Z3A01 Less than 8 weeks gestation of pregnancy: Secondary | ICD-10-CM | POA: Insufficient documentation

## 2021-12-10 HISTORY — DX: Gastro-esophageal reflux disease without esophagitis: K21.9

## 2021-12-10 LAB — CBC
HCT: 37.1 % (ref 36.0–46.0)
Hemoglobin: 12.3 g/dL (ref 12.0–15.0)
MCH: 27.5 pg (ref 26.0–34.0)
MCHC: 33.2 g/dL (ref 30.0–36.0)
MCV: 83 fL (ref 80.0–100.0)
Platelets: 240 10*3/uL (ref 150–400)
RBC: 4.47 MIL/uL (ref 3.87–5.11)
RDW: 14.3 % (ref 11.5–15.5)
WBC: 9 10*3/uL (ref 4.0–10.5)
nRBC: 0 % (ref 0.0–0.2)

## 2021-12-10 LAB — URINALYSIS, ROUTINE W REFLEX MICROSCOPIC
Bilirubin Urine: NEGATIVE
Glucose, UA: NEGATIVE mg/dL
Ketones, ur: 5 mg/dL — AB
Nitrite: NEGATIVE
Protein, ur: NEGATIVE mg/dL
RBC / HPF: >50 RBC/hpf — ABNORMAL HIGH (ref 0–5)
Specific Gravity, Urine: 1.029 (ref 1.005–1.030)
WBC, UA: >50 WBC/hpf — ABNORMAL HIGH (ref 0–5)
pH: 5 (ref 5.0–8.0)

## 2021-12-10 LAB — POCT PREGNANCY, URINE: Preg Test, Ur: POSITIVE — AB

## 2021-12-10 LAB — WET PREP, GENITAL
Sperm: NONE SEEN
Trich, Wet Prep: NONE SEEN
WBC, Wet Prep HPF POC: <10 (ref <10)

## 2021-12-10 LAB — HCG, QUANTITATIVE, PREGNANCY: hCG, Beta Chain, Quant, S: 2405 m[IU]/mL — ABNORMAL HIGH (ref <5)

## 2021-12-10 NOTE — MAU Provider Note (Signed)
History     CSN: [id number]  Arrival date and time: 12/10/21 2024  41 y.o. [id number] @[redacted]w[redacted]d  by LMP presenting with urinary urgency, dysuria, and frequency. Sx started 2-3 days ago. She thinks she has a UTI. Also reports vaginal spotting that started tonight. She saw some in her underwear and when she wipes after using the BR. Last IC was yesterday. Reports using Monistat last week for possible yeast infection but no current sx. Reports mild low abdominal cramping. She has not started care yet.    OB History     Gravida  5   Para  2   Term  2   Preterm      AB  1   Living  2      SAB  1   IAB      Ectopic      Multiple  0   Live Births  2           Past Medical History:  Diagnosis Date   Anxiety    Asthma    GERD (gastroesophageal reflux disease)    Headache    History of PCOS    Hypertension    Hypothyroidism    2009-2012    Past Surgical History:  Procedure Laterality Date   CESAREAN SECTION N/A 08/04/2017   Procedure: CESAREAN SECTION;  Surgeon: 10/04/2017, MD;  Location: [facility] BIRTHING SUIT[facility];  Service: Obstetrics;  Laterality: N/A;   TYMPANOSTOMY TUBE PLACEMENT      Family History  Problem Relation Age of Onset   Hypertension Mother    Hypertension Maternal Aunt    Diabetes Maternal Aunt    Hypertension Maternal Grandmother    Colon cancer Maternal Grandfather     Social History   Tobacco Use   Smoking status: Never   Smokeless tobacco: Never  Vaping Use   Vaping Use: Never used  Substance Use Topics   Alcohol use: No   Drug use: No    Allergies:  Allergies  Allergen Reactions   Flexeril [Cyclobenzaprine] Other (See Comments)    Headaches, nausea, vomiting   Amoxicillin Nausea And Vomiting and Rash    Medications Prior to Admission  Medication Sig Dispense Refill Last Dose   famotidine (PEPCID) 20 MG tablet Take 20 mg by mouth 2 (two) times daily.      labetalol (NORMODYNE) 100 MG tablet TAKE 1 TABLET BY MOUTH TWICE A DAY 180  tablet 0    meloxicam (MOBIC) 15 MG tablet Take 15 mg by mouth daily.      Multiple Vitamin (MULTIVITAMIN WITH MINERALS) TABS tablet Take 1 tablet by mouth daily.      pantoprazole (PROTONIX) 40 MG tablet Take 40 mg by mouth daily.       Review of Systems  Constitutional:  Negative for fever.  Gastrointestinal:  Positive for abdominal pain.  Genitourinary:  Positive for dysuria, frequency, urgency and vaginal bleeding.   Physical Exam   Blood pressure (!) 148/87, pulse 87, temperature 98.8 F (37.1 C), temperature source Oral, resp. rate 17, last menstrual period 10/27/2021, SpO2 100 %, unknown if currently breastfeeding.  Physical Exam Vitals and nursing note reviewed.  Constitutional:      General: She is not in acute distress.    Appearance: Normal appearance.  HENT:     Head: Normocephalic and atraumatic.  Cardiovascular:     Rate and Rhythm: Normal rate.  Pulmonary:     Effort: Pulmonary effort is normal. No respiratory distress.  Musculoskeletal:  General: Normal range of motion.     Cervical back: Normal range of motion.  Neurological:     General: No focal deficit present.     Mental Status: She is alert and oriented to person, place, and time.  Psychiatric:        Mood and Affect: Mood normal.        Behavior: Behavior normal.    Results for orders placed or performed during the hospital encounter of 12/10/21 (from the past 24 hour(s))  Urinalysis, Routine w reflex microscopic Urine, Clean Catch     Status: Abnormal   Collection Time: 12/10/21  9:05 PM  Result Value Ref Range   Color, Urine AMBER (A) YELLOW   APPearance HAZY (A) CLEAR   Specific Gravity, Urine 1.029 1.005 - 1.030   pH 5.0 5.0 - 8.0   Glucose, UA NEGATIVE NEGATIVE mg/dL   Hgb urine dipstick MODERATE (A) NEGATIVE   Bilirubin Urine NEGATIVE NEGATIVE   Ketones, ur 5 (A) NEGATIVE mg/dL   Protein, ur NEGATIVE NEGATIVE mg/dL   Nitrite NEGATIVE NEGATIVE   Leukocytes,Ua MODERATE (A) NEGATIVE    RBC / HPF >50 (H) 0 - 5 RBC/hpf   WBC, UA >50 (H) 0 - 5 WBC/hpf   Bacteria, UA RARE (A) NONE SEEN   Squamous Epithelial / LPF 6-10 0 - 5   Mucus PRESENT    Hyaline Casts, UA PRESENT   Pregnancy, urine POC     Status: Abnormal   Collection Time: 12/10/21  9:07 PM  Result Value Ref Range   Preg Test, Ur POSITIVE (A) NEGATIVE  CBC     Status: None   Collection Time: 12/10/21 10:29 PM  Result Value Ref Range   WBC 9.0 4.0 - 10.5 K/uL   RBC 4.47 3.87 - 5.11 MIL/uL   Hemoglobin 12.3 12.0 - 15.0 g/dL   HCT 11.9 14.7 - 82.9 %   MCV 83.0 80.0 - 100.0 fL   MCH 27.5 26.0 - 34.0 pg   MCHC 33.2 30.0 - 36.0 g/dL   RDW 56.2 13.0 - 86.5 %   Platelets 240 150 - 400 K/uL   nRBC 0.0 0.0 - 0.2 %  hCG, quantitative, pregnancy     Status: Abnormal   Collection Time: 12/10/21 10:29 PM  Result Value Ref Range   hCG, Beta Chain, Quant, S 2,405 (H) <5 mIU/mL  Wet prep, genital     Status: Abnormal   Collection Time: 12/10/21 10:50 PM   Specimen: Vaginal  Result Value Ref Range   Yeast Wet Prep HPF POC PRESENT (A) NONE SEEN   Trich, Wet Prep NONE SEEN NONE SEEN   Clue Cells Wet Prep HPF POC PRESENT (A) NONE SEEN   WBC, Wet Prep HPF POC <10 <10   Sperm NONE SEEN    US OB LESS THAN 14 WEEKS WITH OB TRANSVAGINAL  Result Date: 12/11/2021 CLINICAL DATA:  Vaginal bleeding. EXAM: OBSTETRIC <14 WK [place] AND TRANSVAGINAL OB US TECHNIQUE: Both transabdominal and transvaginal ultrasound examinations were performed for complete evaluation of the gestation as well as the maternal uterus, adnexal regions, and pelvic cul-de-sac. Transvaginal technique was performed to assess early pregnancy. COMPARISON:  03/05/2020. FINDINGS: Intrauterine gestational sac: Single Yolk sac:  No Embryo:  No Cardiac Activity: No Heart Rate: None MSD: 5 mm   5 w   2 d Subchorionic hemorrhage:  None. Maternal uterus/adnexae: The left ovary is within normal limits. A small corpus luteal cyst is noted on the right. No free fluid  in the pelvis.  IMPRESSION: Single intrauterine gestational sac with estimated gestational age of [redacted] weeks 2 days. No yolk sac, fetal pole, or cardiac activity is noted at this time which is likely related to early pregnancy. Short-term follow-up ultrasound is recommended to document viability. Electronically Signed   By: Thornell Sartorius M.D.   On: 12/11/2021 00:09    MAU Course  Procedures  MDM Labs and [place] ordered and reviewed. Will treat for presumptive UTI, culture ordered. Early IUP on [place], recommend viability scan, pt has one scheduled in 2 weeks. Stable for discharge home.   Assessment and Plan   1. Early stage of pregnancy   2. Blood type, Rh positive   3. Urinary tract infection in mother during first trimester of pregnancy    Discharge home [facility] as scheduled Rx Duricef SAB precautions  Allergies as of 12/11/2021       Reactions   Flexeril [cyclobenzaprine] Other (See Comments)   Headaches, nausea, vomiting   Amoxicillin Nausea And Vomiting, Rash        Medication List     STOP taking these medications    famotidine 20 MG tablet Commonly known as: PEPCID   meloxicam 15 MG tablet Commonly known as: MOBIC       TAKE these medications    cefadroxil 500 MG capsule Commonly known as: DURICEF Take 1 capsule (500 mg total) by mouth 2 (two) times daily.   labetalol 100 MG tablet Commonly known as: NORMODYNE TAKE 1 TABLET BY MOUTH TWICE A DAY   multivitamin with minerals Tabs tablet Take 1 tablet by mouth daily.   pantoprazole 40 MG tablet Commonly known as: PROTONIX Take 40 mg by mouth daily.         Donette Larry, CNM 12/11/2021, 1:01 AM

## 2021-12-10 NOTE — MAU Note (Signed)
.  Desiree Singh is a 41 y.o. at Unknown here in MAU reporting: arrived following urgent care visit with reports UTI-stating urgent care would only provide amoxicillin with her + pregnancy test and pt reports allergy to amoxicillin. Pt reports blood in urine reported by urgent care. When in bathroom at urgent care she noticed vaginal bleeding brown in color-with urination here she noticed pinkish red on toilet tissue. Reports mild cramping, 4 on 0-10 scale LMP: 10/27/2021 Onset of complaint: 1900 Pain score: 4 Vitals:   12/10/21 2256 12/10/21 2257  BP: (!) 148/87   Pulse: 87   Resp:  17  Temp:  98.8 F (37.1 C)  SpO2:  100%     Lab orders placed from triage:  see orders per CNM

## 2021-12-11 DIAGNOSIS — O2341 Unspecified infection of urinary tract in pregnancy, first trimester: Secondary | ICD-10-CM | POA: Diagnosis not present

## 2021-12-11 DIAGNOSIS — Z3A01 Less than 8 weeks gestation of pregnancy: Secondary | ICD-10-CM | POA: Diagnosis not present

## 2021-12-11 MED ORDER — CEFADROXIL 500 MG PO CAPS: 500.0000 mg | ORAL_CAPSULE | Freq: Two times a day (BID) | ORAL | 0 refills | Status: DC

## 2021-12-12 LAB — CULTURE, OB URINE: Culture: 10000 — AB

## 2021-12-13 LAB — GC/CHLAMYDIA PROBE AMP (~~LOC~~) NOT AT ARMC
Chlamydia: NEGATIVE
Comment: NEGATIVE
Comment: NORMAL
Neisseria Gonorrhea: NEGATIVE

## 2021-12-13 NOTE — Telephone Encounter (Signed)
Spoke to the patient over the phone. Congratulated her and her family.  Currently on labetalol for BP which she can continue during pregnancy. I am okay if her OB/GYN would like to transition her to calcium channel blockers especially for the first trimester.  Patient states that she will follow-up with her OB/GYN for further guidance  Saraii, Please forward to her Ob/GYN to close the loop.   Kodah Maret Dublin, DO, [facility]

## 2021-12-15 DIAGNOSIS — R8271 Bacteriuria: Secondary | ICD-10-CM | POA: Insufficient documentation

## 2022-02-16 ENCOUNTER — Inpatient Hospital Stay (HOSPITAL_COMMUNITY)
Admission: AD | Admit: 2022-02-16 | Discharge: 2022-02-16 | Disposition: A | Discharge status: Home / Self Care | Payer: Commercial Managed Care - PPO | Attending: Obstetrics and Gynecology | Admitting: Obstetrics and Gynecology

## 2022-02-16 ENCOUNTER — Inpatient Hospital Stay (HOSPITAL_COMMUNITY): Payer: Commercial Managed Care - PPO

## 2022-02-16 ENCOUNTER — Encounter (HOSPITAL_COMMUNITY): Payer: Self-pay | Admitting: Obstetrics and Gynecology

## 2022-02-16 DIAGNOSIS — Z20822 Contact with and (suspected) exposure to covid-19: Secondary | ICD-10-CM | POA: Diagnosis not present

## 2022-02-16 DIAGNOSIS — Z3A14 14 weeks gestation of pregnancy: Secondary | ICD-10-CM | POA: Diagnosis not present

## 2022-02-16 DIAGNOSIS — O98512 Other viral diseases complicating pregnancy, second trimester: Secondary | ICD-10-CM | POA: Diagnosis not present

## 2022-02-16 DIAGNOSIS — O26892 Other specified pregnancy related conditions, second trimester: Secondary | ICD-10-CM | POA: Insufficient documentation

## 2022-02-16 DIAGNOSIS — J40 Bronchitis, not specified as acute or chronic: Secondary | ICD-10-CM | POA: Insufficient documentation

## 2022-02-16 DIAGNOSIS — R059 Cough, unspecified: Secondary | ICD-10-CM

## 2022-02-16 DIAGNOSIS — J4520 Mild intermittent asthma, uncomplicated: Secondary | ICD-10-CM | POA: Diagnosis not present

## 2022-02-16 DIAGNOSIS — Z87898 Personal history of other specified conditions: Secondary | ICD-10-CM

## 2022-02-16 DIAGNOSIS — O99512 Diseases of the respiratory system complicating pregnancy, second trimester: Secondary | ICD-10-CM | POA: Insufficient documentation

## 2022-02-16 LAB — CBC WITH DIFFERENTIAL/PLATELET
Abs Immature Granulocytes: 0.02 10*3/uL (ref 0.00–0.07)
Basophils Absolute: 0 10*3/uL (ref 0.0–0.1)
Basophils Relative: 0 %
Eosinophils Absolute: 0.2 10*3/uL (ref 0.0–0.5)
Eosinophils Relative: 2 %
HCT: 36.7 % (ref 36.0–46.0)
Hemoglobin: 12.5 g/dL (ref 12.0–15.0)
Immature Granulocytes: 0 %
Lymphocytes Relative: 22 %
Lymphs Abs: 2 10*3/uL (ref 0.7–4.0)
MCH: 28.3 pg (ref 26.0–34.0)
MCHC: 34.1 g/dL (ref 30.0–36.0)
MCV: 83 fL (ref 80.0–100.0)
Monocytes Absolute: 0.7 10*3/uL (ref 0.1–1.0)
Monocytes Relative: 7 %
Neutro Abs: 6.2 10*3/uL (ref 1.7–7.7)
Neutrophils Relative %: 69 %
Platelets: 150 10*3/uL (ref 150–400)
RBC: 4.42 MIL/uL (ref 3.87–5.11)
RDW: 14.8 % (ref 11.5–15.5)
WBC: 9 10*3/uL (ref 4.0–10.5)
nRBC: 0 % (ref 0.0–0.2)

## 2022-02-16 LAB — RESP PANEL BY RT-PCR (FLU A&B, COVID) ARPGX2
Influenza A by PCR: NEGATIVE
Influenza B by PCR: NEGATIVE
SARS Coronavirus 2 by RT PCR: NEGATIVE

## 2022-02-16 LAB — BASIC METABOLIC PANEL
Anion gap: 10 (ref 5–15)
BUN: 9 mg/dL (ref 6–20)
CO2: 21 mmol/L — ABNORMAL LOW (ref 22–32)
Calcium: 9 mg/dL (ref 8.9–10.3)
Chloride: 107 mmol/L (ref 98–111)
Creatinine, Ser: 0.69 mg/dL (ref 0.44–1.00)
GFR, Estimated: >60 mL/min (ref >60)
Glucose, Bld: 111 mg/dL — ABNORMAL HIGH (ref 70–99)
Potassium: 3.8 mmol/L (ref 3.5–5.1)
Sodium: 138 mmol/L (ref 135–145)

## 2022-02-16 LAB — GROUP A STREP BY PCR: Group A Strep by PCR: NOT DETECTED

## 2022-02-16 MED ORDER — PREDNISONE 10 MG PO TABS: 20.0000 mg | ORAL_TABLET | Freq: Every day | ORAL | 0 refills | Status: AC

## 2022-02-16 MED ORDER — ALBUTEROL SULFATE HFA 108 (90 BASE) MCG/ACT IN AERS
2.0000 | INHALATION_SPRAY | RESPIRATORY_TRACT | Status: DC | PRN
  Administered 2022-02-16: 2 via RESPIRATORY_TRACT
  Filled 2022-02-16: qty 6.7

## 2022-02-16 MED ORDER — PREDNISONE 50 MG PO TABS
50.0000 mg | ORAL_TABLET | Freq: Once | ORAL | Status: AC
  Administered 2022-02-16: 50 mg via ORAL
  Filled 2022-02-16: qty 1

## 2022-02-16 MED ORDER — GUAIFENESIN 100 MG/5ML PO LIQD
15.0000 mL | Freq: Once | ORAL | Status: AC
  Administered 2022-02-16: 15 mL via ORAL
  Filled 2022-02-16: qty 15

## 2022-02-16 NOTE — MAU Provider Note (Signed)
History     CSN: [id number]  Arrival date and time: 02/16/22 0539   Event Date/Time   First Provider Initiated Contact with Patient 02/16/22 [phone]      Chief Complaint  Patient presents with   Bronchitis   HPI Desiree Singh is a 41 y.o. [id number] at [redacted]w[redacted]d who presents to MAU with chief complaint of non-stop cough and Bronchitis. Patient reports feeling illing about three days ago. At that time she experienced cough, SOB and fever with T-max of 101.7. She presented to Urgent Care two days ago and was told she had Bronchitis. She presented to her OB yesterday and was told to come to MAU for worsening symptoms. Patient is managing her cough with an Albuterol inhaler, Z Pack and Robitussin DM. She has not noticed an improvement in her symptoms but states the Robitussin allows her to sleep. Her mother is ill with identical symptoms. They have both tested negative for COVID in that past few days.  Patient reports low back pain and abdominal pain, onset coinciding with her recurrent cough.   Patient receives prenatal care with [facility] in [place].  OB History     Gravida  5   Para  2   Term  2   Preterm      AB  1   Living  2      SAB  1   IAB      Ectopic      Multiple  0   Live Births  2           Past Medical History:  Diagnosis Date   Anxiety    Asthma    GERD (gastroesophageal reflux disease)    Headache    History of PCOS    Hypertension    Hypothyroidism    2009-2012    Past Surgical History:  Procedure Laterality Date   CESAREAN SECTION N/A 08/04/2017   Procedure: CESAREAN SECTION;  Surgeon: Harold Hedge, MD;  Location: [facility] BIRTHING SUITES;  Service: Obstetrics;  Laterality: N/A;   TYMPANOSTOMY TUBE PLACEMENT      Family History  Problem Relation Age of Onset   Hypertension Mother    Hypertension Maternal Aunt    Diabetes Maternal Aunt    Hypertension Maternal Grandmother    Colon cancer Maternal Grandfather     Social History    Tobacco Use   Smoking status: Never   Smokeless tobacco: Never  Vaping Use   Vaping Use: Never used  Substance Use Topics   Alcohol use: No   Drug use: No    Allergies:  Allergies  Allergen Reactions   Flexeril [Cyclobenzaprine] Other (See Comments)    Headaches, nausea, vomiting   Amoxicillin Nausea And Vomiting and Rash    Medications Prior to Admission  Medication Sig Dispense Refill Last Dose   albuterol (VENTOLIN HFA) 108 (90 Base) MCG/ACT inhaler Inhale 2 puffs into the lungs every 6 (six) hours as needed for wheezing or shortness of breath.   02/16/2022   azithromycin (ZITHROMAX) 250 MG tablet Take 250 mg by mouth daily.   02/15/2022   labetalol (NORMODYNE) 100 MG tablet TAKE 1 TABLET BY MOUTH TWICE A DAY 180 tablet 0 02/15/2022   Multiple Vitamin (MULTIVITAMIN WITH MINERALS) TABS tablet Take 1 tablet by mouth daily.   02/15/2022   cefadroxil (DURICEF) 500 MG capsule Take 1 capsule (500 mg total) by mouth 2 (two) times daily. 14 capsule 0    pantoprazole (PROTONIX) 40 MG tablet Take  40 mg by mouth daily.       Review of Systems  Constitutional:  Positive for fatigue.  Respiratory:  Positive for cough. Negative for shortness of breath.   Cardiovascular:  Negative for chest pain and palpitations.  Gastrointestinal:  Positive for abdominal pain.  Musculoskeletal:  Positive for back pain.  All other systems reviewed and are negative.  Physical Exam   Blood pressure 130/78, pulse (!) 116, temperature 98.2 F (36.8 C), temperature source Oral, resp. rate 20, height 5\' 9"  (1.753 m), weight 127.8 kg, last menstrual period 10/27/2021, SpO2 99 %, unknown if currently breastfeeding.  Physical Exam Vitals and nursing note reviewed. Exam conducted with a chaperone present.  Constitutional:      Appearance: Normal appearance.  Cardiovascular:     Rate and Rhythm: Tachycardia present.     Pulses: Normal pulses.     Heart sounds: Normal heart sounds.  Pulmonary:     Effort:  Pulmonary effort is normal.     Breath sounds: Normal breath sounds.  Abdominal:     General: Abdomen is flat.     Tenderness: There is no abdominal tenderness.  Skin:    Capillary Refill: Capillary refill takes less than 2 seconds.  Neurological:     Mental Status: She is alert and oriented to person, place, and time.  Psychiatric:        Mood and Affect: Mood normal.        Behavior: Behavior normal.        Thought Content: Thought content normal.        Judgment: Judgment normal.     MAU Course  Procedures  MDM  --FHR 140 bpm and active fetal movement on BSUS.  --Pertinent negatives: patient currently does not have adventitious breath sounds, activity intolerance, leukocytosis --Patient with rare cough s/p Albuterol inhaler in MAU. Patient states the MAU inhaler seemed to "release more medication" than the inhaler she was prescribed and has been using at home --0730: CNM returned to bedside. Reviewed CBC and Chest Xray results. Discussed my care coordination with Dr. 10/29/2021, Faculty [employer] Fellow and her recommendation for Strep Throat swab. Plan to discharge patient home once BMET results. Patient agreeable to this plan  Orders Placed This Encounter  Procedures   Resp Panel by RT-PCR (Flu A&B, Covid) Anterior Nasal Swab   Culture, group A strep   DG Chest Port 1 View   CBC with Differential/Platelet   Basic metabolic panel   Airborne and Contact precautions   Pulse oximetry, continuous   Meds ordered this encounter  Medications   albuterol (VENTOLIN HFA) 108 (90 Base) MCG/ACT inhaler 2 puff   predniSONE (DELTASONE) tablet 50 mg   guaiFENesin (ROBITUSSIN) 100 MG/5ML liquid 15 mL   Results for orders placed or performed during the hospital encounter of 02/16/22 (from the past 24 hour(s))  CBC with Differential/Platelet     Status: None   Collection Time: 02/16/22  6:36 AM  Result Value Ref Range   WBC 9.0 4.0 - 10.5 K/uL   RBC 4.42 3.87 - 5.11 MIL/uL    Hemoglobin 12.5 12.0 - 15.0 g/dL   HCT 02/18/22 15.7 - 26.2 %   MCV 83.0 80.0 - 100.0 fL   MCH 28.3 26.0 - 34.0 pg   MCHC 34.1 30.0 - 36.0 g/dL   RDW 03.5 59.7 - 41.6 %   Platelets 150 150 - 400 K/uL   nRBC 0.0 0.0 - 0.2 %   Neutrophils Relative % 69 %  Neutro Abs 6.2 1.7 - 7.7 K/uL   Lymphocytes Relative 22 %   Lymphs Abs 2.0 0.7 - 4.0 K/uL   Monocytes Relative 7 %   Monocytes Absolute 0.7 0.1 - 1.0 K/uL   Eosinophils Relative 2 %   Eosinophils Absolute 0.2 0.0 - 0.5 K/uL   Basophils Relative 0 %   Basophils Absolute 0.0 0.0 - 0.1 K/uL   Immature Granulocytes 0 %   Abs Immature Granulocytes 0.02 0.00 - 0.07 K/uL   DG Chest Port 1 View  Result Date: 02/16/2022 CLINICAL DATA:  41 year old pregnant female with cough. EXAM: PORTABLE CHEST 1 VIEW COMPARISON:  CTA chest 12/16/2020 and earlier. FINDINGS: Portable AP upright view at 0640 hours. Normal lung volumes and mediastinal contours. Visualized tracheal air column is within normal limits. Allowing for portable technique the lungs are clear. No pneumothorax or pleural effusion. Negative visible bowel gas. No acute osseous abnormality identified. IMPRESSION: Negative portable chest. Electronically Signed   By: Odessa Fleming M.D.   On: 02/16/2022 07:18    Meds ordered this encounter  Medications   albuterol (VENTOLIN HFA) 108 (90 Base) MCG/ACT inhaler 2 puff   predniSONE (DELTASONE) tablet 50 mg   guaiFENesin (ROBITUSSIN) 100 MG/5ML liquid 15 mL   predniSONE (DELTASONE) 10 MG tablet    Sig: Take 2 tablets (20 mg total) by mouth daily for 3 days.    Dispense:  6 tablet    Refill:  0    Order Specific Question:   Supervising Provider    Answer:   Jarales Bing [[id number]]   Assessment and Plan  --41 y.o. [id number] at [redacted]w[redacted]d  --FHR 140 bpm via BSUS --Bronchitis 2/2 viral illness --Mild asthma exacerbation --S/p normal Chest xray --Pulse ox >99% on RA throughout MAU encounter --Coughing significantly improved prior to discharge --Discharge  home in stable condition  F/U: --Strep throat and COVID swabs in work. CNM will communicate via active MyChart account  Clayton Bibles, MSA, MSN, CNM Certified Nurse Midwife, [facility] for [employer], [facility] 02/16/22 8:12 AM

## 2022-02-16 NOTE — Discharge Instructions (Signed)

## 2022-02-16 NOTE — MAU Note (Signed)
..  Desiree Singh is a 41 y.o. at [redacted]w[redacted]d here in MAU reporting: went to urgent care 2 days ago, diagnosed with bronchitis and was told to come to ED if her symptoms worsened. Has had an uncontrollable cough for 50 minutes. Reports that she has been coughing so much her lower back hurts and is having abdominal cramping.   Pain score: 2/10 Vitals:   02/16/22 0553  BP: 130/78  Pulse: (!) 116  Resp: 20  Temp: 98.2 F (36.8 C)  SpO2: 99%

## 2022-04-11 DIAGNOSIS — L0390 Cellulitis, unspecified: Secondary | ICD-10-CM

## 2022-04-11 HISTORY — DX: Cellulitis, unspecified: L03.90

## 2022-04-24 ENCOUNTER — Inpatient Hospital Stay (HOSPITAL_COMMUNITY): Payer: Commercial Managed Care - PPO

## 2022-04-24 ENCOUNTER — Other Ambulatory Visit: Payer: Self-pay

## 2022-04-24 ENCOUNTER — Inpatient Hospital Stay (HOSPITAL_COMMUNITY)
Admission: AD | Admit: 2022-04-24 | Discharge: 2022-04-25 | Disposition: A | Discharge status: Home / Self Care | Payer: Commercial Managed Care - PPO | Attending: Obstetrics & Gynecology | Admitting: Obstetrics & Gynecology

## 2022-04-24 ENCOUNTER — Encounter (HOSPITAL_COMMUNITY): Payer: Self-pay | Admitting: Obstetrics & Gynecology

## 2022-04-24 DIAGNOSIS — Z79899 Other long term (current) drug therapy: Secondary | ICD-10-CM | POA: Insufficient documentation

## 2022-04-24 DIAGNOSIS — M549 Dorsalgia, unspecified: Secondary | ICD-10-CM | POA: Diagnosis not present

## 2022-04-24 DIAGNOSIS — R110 Nausea: Secondary | ICD-10-CM | POA: Diagnosis not present

## 2022-04-24 DIAGNOSIS — Z3A24 24 weeks gestation of pregnancy: Secondary | ICD-10-CM | POA: Diagnosis not present

## 2022-04-24 DIAGNOSIS — O26892 Other specified pregnancy related conditions, second trimester: Secondary | ICD-10-CM | POA: Diagnosis present

## 2022-04-24 DIAGNOSIS — R197 Diarrhea, unspecified: Secondary | ICD-10-CM | POA: Diagnosis not present

## 2022-04-24 DIAGNOSIS — O09522 Supervision of elderly multigravida, second trimester: Secondary | ICD-10-CM | POA: Insufficient documentation

## 2022-04-24 DIAGNOSIS — R202 Paresthesia of skin: Secondary | ICD-10-CM | POA: Insufficient documentation

## 2022-04-24 DIAGNOSIS — N200 Calculus of kidney: Secondary | ICD-10-CM | POA: Insufficient documentation

## 2022-04-24 DIAGNOSIS — O99891 Other specified diseases and conditions complicating pregnancy: Secondary | ICD-10-CM | POA: Diagnosis not present

## 2022-04-24 DIAGNOSIS — R112 Nausea with vomiting, unspecified: Secondary | ICD-10-CM

## 2022-04-24 DIAGNOSIS — R8271 Bacteriuria: Secondary | ICD-10-CM

## 2022-04-24 LAB — CBC WITH DIFFERENTIAL/PLATELET
Abs Immature Granulocytes: 0.03 10*3/uL (ref 0.00–0.07)
Basophils Absolute: 0 10*3/uL (ref 0.0–0.1)
Basophils Relative: 0 %
Eosinophils Absolute: 0.1 10*3/uL (ref 0.0–0.5)
Eosinophils Relative: 1 %
HCT: 34.5 % — ABNORMAL LOW (ref 36.0–46.0)
Hemoglobin: 11.7 g/dL — ABNORMAL LOW (ref 12.0–15.0)
Immature Granulocytes: 0 %
Lymphocytes Relative: 18 %
Lymphs Abs: 2.1 10*3/uL (ref 0.7–4.0)
MCH: 28.9 pg (ref 26.0–34.0)
MCHC: 33.9 g/dL (ref 30.0–36.0)
MCV: 85.2 fL (ref 80.0–100.0)
Monocytes Absolute: 0.7 10*3/uL (ref 0.1–1.0)
Monocytes Relative: 6 %
Neutro Abs: 8.4 10*3/uL — ABNORMAL HIGH (ref 1.7–7.7)
Neutrophils Relative %: 75 %
Platelets: 160 10*3/uL (ref 150–400)
RBC: 4.05 MIL/uL (ref 3.87–5.11)
RDW: 14.2 % (ref 11.5–15.5)
WBC: 11.4 10*3/uL — ABNORMAL HIGH (ref 4.0–10.5)
nRBC: 0 % (ref 0.0–0.2)

## 2022-04-24 LAB — COMPREHENSIVE METABOLIC PANEL
ALT: 23 U/L (ref 0–44)
AST: 21 U/L (ref 15–41)
Albumin: 2.9 g/dL — ABNORMAL LOW (ref 3.5–5.0)
Alkaline Phosphatase: 56 U/L (ref 38–126)
Anion gap: 10 (ref 5–15)
BUN: 10 mg/dL (ref 6–20)
CO2: 21 mmol/L — ABNORMAL LOW (ref 22–32)
Calcium: 9.5 mg/dL (ref 8.9–10.3)
Chloride: 104 mmol/L (ref 98–111)
Creatinine, Ser: 0.73 mg/dL (ref 0.44–1.00)
GFR, Estimated: >60 mL/min (ref >60)
Glucose, Bld: 123 mg/dL — ABNORMAL HIGH (ref 70–99)
Potassium: 3.8 mmol/L (ref 3.5–5.1)
Sodium: 135 mmol/L (ref 135–145)
Total Bilirubin: 0.2 mg/dL — ABNORMAL LOW (ref 0.3–1.2)
Total Protein: 6.6 g/dL (ref 6.5–8.1)

## 2022-04-24 LAB — URINALYSIS, ROUTINE W REFLEX MICROSCOPIC
Bilirubin Urine: NEGATIVE
Glucose, UA: NEGATIVE mg/dL
Hgb urine dipstick: NEGATIVE
Ketones, ur: 20 mg/dL — AB
Leukocytes,Ua: NEGATIVE
Nitrite: NEGATIVE
Protein, ur: 30 mg/dL — AB
Specific Gravity, Urine: 1.023 (ref 1.005–1.030)
pH: 5 (ref 5.0–8.0)

## 2022-04-24 LAB — LIPASE, BLOOD: Lipase: 39 U/L (ref 11–51)

## 2022-04-24 MED ORDER — METOCLOPRAMIDE HCL 5 MG/ML IJ SOLN
10.0000 mg | Freq: Once | INTRAMUSCULAR | Status: AC
  Administered 2022-04-24: 10 mg via INTRAVENOUS
  Filled 2022-04-24: qty 2

## 2022-04-24 MED ORDER — HYDROMORPHONE HCL 1 MG/ML IJ SOLN
1.0000 mg | Freq: Once | INTRAMUSCULAR | Status: DC
  Filled 2022-04-24: qty 1

## 2022-04-24 MED ORDER — LACTATED RINGERS IV BOLUS
1000.0000 mL | Freq: Once | INTRAVENOUS | Status: AC
  Administered 2022-04-24: 1000 mL via INTRAVENOUS

## 2022-04-24 MED ORDER — FAMOTIDINE IN NACL 20-0.9 MG/50ML-% IV SOLN
20.0000 mg | Freq: Once | INTRAVENOUS | Status: AC
  Administered 2022-04-25: 20 mg via INTRAVENOUS
  Filled 2022-04-24: qty 50

## 2022-04-24 MED ORDER — CAPSAICIN 0.025 % EX CREA
TOPICAL_CREAM | Freq: Two times a day (BID) | CUTANEOUS | Status: DC
  Filled 2022-04-24: qty 60

## 2022-04-24 MED ORDER — LACTATED RINGERS IV SOLN: Freq: Once | INTRAVENOUS | Status: AC

## 2022-04-24 MED ORDER — KETOROLAC TROMETHAMINE 30 MG/ML IJ SOLN
30.0000 mg | Freq: Once | INTRAMUSCULAR | Status: AC
  Administered 2022-04-24: 30 mg via INTRAVENOUS
  Filled 2022-04-24: qty 1

## 2022-04-24 MED ORDER — ONDANSETRON HCL 40 MG/20ML IJ SOLN
8.0000 mg | Freq: Once | INTRAMUSCULAR | Status: AC
  Administered 2022-04-24: 8 mg via INTRAVENOUS
  Filled 2022-04-24: qty 4

## 2022-04-24 NOTE — MAU Note (Addendum)
Desiree Singh is a 41 y.o. at [redacted]w[redacted]d here in MAU reporting: rt sided flank pain for 4-5 wks, started a few wks ago, spasms.. Nausea/vomiting and diarrhea started on Friday night.  Still going on.  Office called her in some Zofran - took it at 1300, still unable to keep water or anything down.  No one else at home is sick.  Emesis is now neon yellow.  Thursday, she finished a round of antibiotics for cellulitis, on rt lower inner thigh.  Denies vag bleeding, LOF. Has anterior placenta, so doesn't really feel a lot of movement.  Neg CVA tenderness.  Onset of complaint: ongoing,m worse today Pain score: 9 Vitals:   04/24/22 1745  BP: 117/76  Pulse: (!) 112  Resp: 20  Temp: 98.6 F (37 C)  SpO2: 97%     FHT:154 Lab orders placed from triage:  isolation and UA

## 2022-04-24 NOTE — MAU Provider Note (Cosign Needed)
History     CSN: [id number]  Arrival date and time: 04/24/22 1726   Event Date/Time   First Provider Initiated Contact with Patient 04/24/22 1835      Chief Complaint  Patient presents with   Flank Pain   Emesis   Nausea   Diarrhea   HPI  Ms.Desiree Singh is a 41 y.o. female [phone] @ 103w6d here with back pain that started 4 weeks ago. She has been going to Pt and getting dry needling for her back pain.  On Friday she starting having nausea and diarrhea. She reports worsening back pain since Friday when the N/v started. She has not been able to keep anything down other than small sips of sprite. She has not taken anything for the back pain. Putting pressure makes the back pain better. She has taken zofran today and last took this at 1330. Nothing makes the pain worse. Walking or laying down the pain is the same in her back. No vaginal bleeding. No fever. She does report tingling down her right leg. No numbness.  She reports having to have assistance with mobilization getting into the hospital.   OB History     Gravida  5   Para  2   Term  2   Preterm      AB  1   Living  2      SAB  1   IAB      Ectopic      Multiple  0   Live Births  2           Past Medical History:  Diagnosis Date   Anxiety    Asthma    cellulitis 04/11/2022   right leg   GERD (gastroesophageal reflux disease)    Headache    History of PCOS    Hypertension    Hypothyroidism    2009-2012    Past Surgical History:  Procedure Laterality Date   CESAREAN SECTION N/A 08/04/2017   Procedure: CESAREAN SECTION;  Surgeon: Harold Hedge, MD;  Location: [facility] BIRTHING SUITES;  Service: Obstetrics;  Laterality: N/A;   TYMPANOSTOMY TUBE PLACEMENT      Family History  Problem Relation Age of Onset   Hypertension Mother    Hypertension Maternal Aunt    Diabetes Maternal Aunt    Hypertension Maternal Grandmother    Colon cancer Maternal Grandfather     Social History   Tobacco Use    Smoking status: Never   Smokeless tobacco: Never  Vaping Use   Vaping Use: Never used  Substance Use Topics   Alcohol use: No   Drug use: No    Allergies:  Allergies  Allergen Reactions   Flexeril [Cyclobenzaprine] Other (See Comments)    Headaches, nausea, vomiting   Amoxicillin Nausea And Vomiting and Rash    Medications Prior to Admission  Medication Sig Dispense Refill Last Dose   albuterol (VENTOLIN HFA) 108 (90 Base) MCG/ACT inhaler Inhale 2 puffs into the lungs every 6 (six) hours as needed for wheezing or shortness of breath.   Past Month   cephALEXin (KEFLEX) 250 MG capsule Take 250 mg by mouth 4 (four) times daily. Finished on 04/21/22   Past Week   labetalol (NORMODYNE) 200 MG tablet Take 200 mg by mouth 2 (two) times daily.   04/24/2022 at 1300   Multiple Vitamin (MULTIVITAMIN WITH MINERALS) TABS tablet Take 1 tablet by mouth daily.   Past Week   ondansetron (ZOFRAN) 4 MG tablet Take 4  mg by mouth every 8 (eight) hours as needed for nausea or vomiting.      azithromycin (ZITHROMAX) 250 MG tablet Take 250 mg by mouth daily.   Unknown   Results for orders placed or performed during the hospital encounter of 04/24/22 (from the past 48 hour(s))  Urinalysis, Routine w reflex microscopic Urine, Clean Catch     Status: Abnormal   Collection Time: 04/24/22  6:20 PM  Result Value Ref Range   Color, Urine AMBER (A) YELLOW    Comment: BIOCHEMICALS MAY BE AFFECTED BY COLOR   APPearance CLOUDY (A) CLEAR   Specific Gravity, Urine 1.023 1.005 - 1.030   pH 5.0 5.0 - 8.0   Glucose, UA NEGATIVE NEGATIVE mg/dL   Hgb urine dipstick NEGATIVE NEGATIVE   Bilirubin Urine NEGATIVE NEGATIVE   Ketones, ur 20 (A) NEGATIVE mg/dL   Protein, ur 30 (A) NEGATIVE mg/dL   Nitrite NEGATIVE NEGATIVE   Leukocytes,Ua NEGATIVE NEGATIVE   RBC / HPF 0-5 0 - 5 RBC/hpf   WBC, UA 6-10 0 - 5 WBC/hpf   Bacteria, UA RARE (A) NONE SEEN   Squamous Epithelial / LPF 21-50 0 - 5   Mucus PRESENT    Hyaline  Casts, UA PRESENT     Comment: Performed at [facility], [address] [place], [place] [address]  CBC with Differential/Platelet     Status: Abnormal   Collection Time: 04/24/22  6:48 PM  Result Value Ref Range   WBC 11.4 (H) 4.0 - 10.5 K/uL   RBC 4.05 3.87 - 5.11 MIL/uL   Hemoglobin 11.7 (L) 12.0 - 15.0 g/dL   HCT 40.9 (L) 81.1 - 91.4 %   MCV 85.2 80.0 - 100.0 fL   MCH 28.9 26.0 - 34.0 pg   MCHC 33.9 30.0 - 36.0 g/dL   RDW 78.2 95.6 - 21.3 %   Platelets 160 150 - 400 K/uL    Comment: REPEATED TO VERIFY   nRBC 0.0 0.0 - 0.2 %   Neutrophils Relative % 75 %   Neutro Abs 8.4 (H) 1.7 - 7.7 K/uL   Lymphocytes Relative 18 %   Lymphs Abs 2.1 0.7 - 4.0 K/uL   Monocytes Relative 6 %   Monocytes Absolute 0.7 0.1 - 1.0 K/uL   Eosinophils Relative 1 %   Eosinophils Absolute 0.1 0.0 - 0.5 K/uL   Basophils Relative 0 %   Basophils Absolute 0.0 0.0 - 0.1 K/uL   Immature Granulocytes 0 %   Abs Immature Granulocytes 0.03 0.00 - 0.07 K/uL    Comment: Performed at [facility], [address], [place], [place] [address]  Comprehensive metabolic panel     Status: Abnormal   Collection Time: 04/24/22  6:48 PM  Result Value Ref Range   Sodium 135 135 - 145 mmol/L   Potassium 3.8 3.5 - 5.1 mmol/L   Chloride 104 98 - 111 mmol/L   CO2 21 (L) 22 - 32 mmol/L   Glucose, Bld 123 (H) 70 - 99 mg/dL    Comment: Glucose reference range applies only to samples taken after fasting for at least 8 hours.   BUN 10 6 - 20 mg/dL   Creatinine, Ser 8.46 0.44 - 1.00 mg/dL   Calcium 9.5 8.9 - 96.2 mg/dL   Total Protein 6.6 6.5 - 8.1 g/dL   Albumin 2.9 (L) 3.5 - 5.0 g/dL   AST 21 15 - 41 U/L   ALT 23 0 - 44 U/L  Alkaline Phosphatase 56 38 - 126 U/L   Total Bilirubin 0.2 (L) 0.3 - 1.2 mg/dL   GFR, Estimated >43 >32 mL/min    Comment: (NOTE) Calculated using the CKD-EPI Creatinine Equation (2021)    Anion gap 10 5 - 15    Comment: Performed at [facility], [address],  [place], [place] [address]  Lipase, blood     Status: None   Collection Time: 04/24/22  6:48 PM  Result Value Ref Range   Lipase 39 11 - 51 U/L    Comment: Performed at [facility], [address], [place], [place] [address]     Review of Systems  Constitutional:  Negative for fever.  Gastrointestinal:  Positive for diarrhea, nausea and vomiting. Negative for constipation.  Genitourinary:  Positive for flank pain. Negative for dysuria and frequency.  Musculoskeletal:  Positive for back pain, gait problem and myalgias.   Physical Exam   Blood pressure 126/72, pulse (!) 105, temperature 98.6 F (37 C), temperature source Oral, resp. rate 20, height 5\' 9"  (1.753 m), weight 127.9 kg, last menstrual period 10/27/2021, SpO2 97 %, unknown if currently breastfeeding.  Patient Vitals for the past 24 hrs:  BP Temp Temp src Pulse Resp SpO2 Height Weight  04/24/22 1820 126/72 -- -- (!) 105 -- -- -- --  04/24/22 1745 117/76 98.6 F (37 C) Oral (!) 112 20 97 % 5\' 9"  (1.753 m) 127.9 kg   Physical Exam Constitutional:      Appearance: Normal appearance.  HENT:     Head: Normocephalic.  Abdominal:     Tenderness: There is right CVA tenderness.  Neurological:     Mental Status: She is alert and oriented to person, place, and time.     GCS: GCS eye subscore is 4. GCS verbal subscore is 5. GCS motor subscore is 6.     Motor: Weakness (Right leg) present.  Psychiatric:        Behavior: Behavior is cooperative.    Results for orders placed or performed during the hospital encounter of 04/24/22 (from the past 24 hour(s))  Urinalysis, Routine w reflex microscopic Urine, Clean Catch     Status: Abnormal   Collection Time: 04/24/22  6:20 PM  Result Value Ref Range   Color, Urine AMBER (A) YELLOW   APPearance CLOUDY (A) CLEAR   Specific Gravity, Urine 1.023 1.005 - 1.030   pH 5.0 5.0 - 8.0   Glucose, UA NEGATIVE NEGATIVE mg/dL   Hgb urine dipstick NEGATIVE NEGATIVE   Bilirubin Urine NEGATIVE  NEGATIVE   Ketones, ur 20 (A) NEGATIVE mg/dL   Protein, ur 30 (A) NEGATIVE mg/dL   Nitrite NEGATIVE NEGATIVE   Leukocytes,Ua NEGATIVE NEGATIVE   RBC / HPF 0-5 0 - 5 RBC/hpf   WBC, UA 6-10 0 - 5 WBC/hpf   Bacteria, UA RARE (A) NONE SEEN   Squamous Epithelial / LPF 21-50 0 - 5   Mucus PRESENT    Hyaline Casts, UA PRESENT   CBC with Differential/Platelet     Status: Abnormal   Collection Time: 04/24/22  6:48 PM  Result Value Ref Range   WBC 11.4 (H) 4.0 - 10.5 K/uL   RBC 4.05 3.87 - 5.11 MIL/uL   Hemoglobin 11.7 (L) 12.0 - 15.0 g/dL   HCT 04/26/22 (L) 04/26/22 - 63.0 %   MCV 85.2 80.0 - 100.0 fL   MCH 28.9 26.0 - 34.0 pg   MCHC 33.9 30.0 - 36.0 g/dL  RDW 14.2 11.5 - 15.5 %   Platelets 160 150 - 400 K/uL   nRBC 0.0 0.0 - 0.2 %   Neutrophils Relative % 75 %   Neutro Abs 8.4 (H) 1.7 - 7.7 K/uL   Lymphocytes Relative 18 %   Lymphs Abs 2.1 0.7 - 4.0 K/uL   Monocytes Relative 6 %   Monocytes Absolute 0.7 0.1 - 1.0 K/uL   Eosinophils Relative 1 %   Eosinophils Absolute 0.1 0.0 - 0.5 K/uL   Basophils Relative 0 %   Basophils Absolute 0.0 0.0 - 0.1 K/uL   Immature Granulocytes 0 %   Abs Immature Granulocytes 0.03 0.00 - 0.07 K/uL  Comprehensive metabolic panel     Status: Abnormal   Collection Time: 04/24/22  6:48 PM  Result Value Ref Range   Sodium 135 135 - 145 mmol/L   Potassium 3.8 3.5 - 5.1 mmol/L   Chloride 104 98 - 111 mmol/L   CO2 21 (L) 22 - 32 mmol/L   Glucose, Bld 123 (H) 70 - 99 mg/dL   BUN 10 6 - 20 mg/dL   Creatinine, Ser 5.640.73 0.44 - 1.00 mg/dL   Calcium 9.5 8.9 - 33.210.3 mg/dL   Total Protein 6.6 6.5 - 8.1 g/dL   Albumin 2.9 (L) 3.5 - 5.0 g/dL   AST 21 15 - 41 U/L   ALT 23 0 - 44 U/L   Alkaline Phosphatase 56 38 - 126 U/L   Total Bilirubin 0.2 (L) 0.3 - 1.2 mg/dL   GFR, Estimated >95>60 >18>60 mL/min   Anion gap 10 5 - 15  Lipase, blood     Status: None   Collection Time: 04/24/22  6:48 PM  Result Value Ref Range   Lipase 39 11 - 51 U/L   US RENAL  Result Date:  04/25/2022 CLINICAL DATA:  Costovertebral angle tenderness. EXAM: RENAL / URINARY TRACT ULTRASOUND COMPLETE COMPARISON:  Lumbar spine MRI 04/24/2022. CT renal stone 11/11/2019. FINDINGS: Right Kidney: Renal measurements: 13.6 x 5.3 x 6.6 cm = volume: 245 mL. Echogenicity within normal limits. No mass or hydronephrosis visualized. Left Kidney: Renal measurements: 12.7 x 6.3 x 7.1 cm = volume: 295 mL. Echogenicity within normal limits. No mass or hydronephrosis visualized. Bladder: Appears normal for degree of bladder distention. Other: None. IMPRESSION: Normal renal ultrasound. Electronically Signed   By: Darliss CheneyAmy  Guttmann M.D.   On: 04/25/2022 01:03   MR LUMBAR SPINE WO CONTRAST  Result Date: 04/24/2022 CLINICAL DATA:  Flank pain, nausea and low back pain. Twenty-five weeks pregnant. EXAM: MRI THORACIC AND LUMBAR SPINE WITHOUT CONTRAST TECHNIQUE: Multiplanar and multiecho pulse sequences of the thoracic and lumbar spine were obtained without intravenous contrast. COMPARISON:  None Available. FINDINGS: MRI THORACIC SPINE FINDINGS Alignment:  Physiologic. Vertebrae: No fracture, evidence of discitis, or bone lesion. Cord:  Normal signal and morphology. Paraspinal and other soft tissues: Negative. Disc levels: Small disc bulge at T11-12 without spinal canal stenosis. The other disc levels are normal. MRI LUMBAR SPINE FINDINGS Segmentation:  Standard. Alignment:  Physiologic. Vertebrae:  No fracture, evidence of discitis, or bone lesion. Conus medullaris and cauda equina: Conus extends to the L1 level. Conus and cauda equina appear normal. Paraspinal and other soft tissues: Gravid uterus Disc levels: L1-L2: Normal disc space and facet joints. No spinal canal stenosis. No neural foraminal stenosis. L2-L3: Normal disc space and facet joints. No spinal canal stenosis. No neural foraminal stenosis. L3-L4: Normal disc space and facet joints. No spinal canal stenosis. No neural foraminal  stenosis. L4-L5: Normal disc space  and facet joints. No spinal canal stenosis. No neural foraminal stenosis. L5-S1: Small central disc protrusion. No spinal canal stenosis. No neural foraminal stenosis. Visualized sacrum: Normal. IMPRESSION: 1. No acute abnormality of the thoracic or lumbar spine. 2. Small central disc protrusion at L5-S1 without spinal canal or neural foraminal stenosis. Electronically Signed   By: Deatra Robinson M.D.   On: 04/24/2022 20:24   MR THORACIC SPINE WO CONTRAST  Result Date: 04/24/2022 CLINICAL DATA:  Flank pain, nausea and low back pain. Twenty-five weeks pregnant. EXAM: MRI THORACIC AND LUMBAR SPINE WITHOUT CONTRAST TECHNIQUE: Multiplanar and multiecho pulse sequences of the thoracic and lumbar spine were obtained without intravenous contrast. COMPARISON:  None Available. FINDINGS: MRI THORACIC SPINE FINDINGS Alignment:  Physiologic. Vertebrae: No fracture, evidence of discitis, or bone lesion. Cord:  Normal signal and morphology. Paraspinal and other soft tissues: Negative. Disc levels: Small disc bulge at T11-12 without spinal canal stenosis. The other disc levels are normal. MRI LUMBAR SPINE FINDINGS Segmentation:  Standard. Alignment:  Physiologic. Vertebrae:  No fracture, evidence of discitis, or bone lesion. Conus medullaris and cauda equina: Conus extends to the L1 level. Conus and cauda equina appear normal. Paraspinal and other soft tissues: Gravid uterus Disc levels: L1-L2: Normal disc space and facet joints. No spinal canal stenosis. No neural foraminal stenosis. L2-L3: Normal disc space and facet joints. No spinal canal stenosis. No neural foraminal stenosis. L3-L4: Normal disc space and facet joints. No spinal canal stenosis. No neural foraminal stenosis. L4-L5: Normal disc space and facet joints. No spinal canal stenosis. No neural foraminal stenosis. L5-S1: Small central disc protrusion. No spinal canal stenosis. No neural foraminal stenosis. Visualized sacrum: Normal. IMPRESSION: 1. No acute  abnormality of the thoracic or lumbar spine. 2. Small central disc protrusion at L5-S1 without spinal canal or neural foraminal stenosis. Electronically Signed   By: Deatra Robinson M.D.   On: 04/24/2022 20:24    MAU Course  Procedures  MDM  Lr bolus with zofran IV Toradol 30 mg IV  CBC, CMP, Lipase  MRI of spine Thoracic and lumbar ordered  Report given to Donia Ast NP who resumes care of the patient at 7:00PM  (Above from Blanche East, FNP)  -assumed care of patient -to bedside to evaluate pt condition after medication -pt has not yet had Zofran, but has had Toradol almost 2 hours ago and reports it "took the edge off" the pain and pain from 10/10 on admission to 6/10 at this time -pt reports this has happened to her before - back in June 2021 and she was seen by the ED, given toradol and morphine and referred back to PCP for management. Pt states she did not go to PCP or elsewhere for further management because the pain subsided after her trip to the ED and did not exacerbate again until 4 weeks ago. Pt states this feels exactly like her pain from her ED visit with the exception of the intermittent tingling that she feels throughout her right leg. -pt also denies muscle weakness or loss of sensation in the right leg. States the reason it is difficult for her to walk is because of pain, not because of loss of mobility. When she does walk and strikes her heel she reports sharp, shooting pains that radiate up from the heel throughout her leg and to the right side of her lower back. -pt states she is in PT at this time for the symptoms and they have  told her that she has tight muscles that are causing her issues. Pt states that she does get relief from the pain for a few days after her PT sessions, but that the pain returns between sessions. Patient states heat also helps, but only while applied, not when removed. -pt reports no issues with left leg or arms -pt states when she is lying still there  is only her back pain, but no leg pain or other issues -neuro exam shows strength and sensation intact on bilateral lower extremities, though neuro exam did aggravate pain  -spoke with Dr. Iver Nestle from neurology who reviewed patient history and MRI findings and states that no additional neurological work-up is warranted, it is reassuring that the pain gets better with PT, frequency of PT should be increased, and patient can be referred to neurology outpatient  11:27pm - called to bedside by RN because patient moaning in pain. Provider to bedside and patient not moaning and able to carry on a conversation without difficulty, but does appear to be having back spasms due to quick, jerky movements that favor her right lower back. Tenderness on exam over right lower back, no tenderness on left side. Patient states pain back to 9/10. Ordering Dilaudid .  -N/V -UA: amber/cloudy/20ketones/30PRO/rare bacteria -CBC w/Diff: WBCs 11.4, no concerning results -CMP: no abnormalities requiring treatment -1L LR +  Pepcid +  Zofran +  Reglan given, pt reports vomiting resolved, still with intermittent nausea -pt able to urinate after fluids -PO challenge successful  -EFM: reactive       -baseline: 150       -variability: moderate       -accels: present, 10x10       -decels: absent       -TOCO: quiet, but difficult to trace at times d/t movement  -renal US pending -consulted with Dr. Shawnie Pons who agrees with likely musculoskeletal origin and recommends TENS unit, Voltaren cream, lidocaine patches, thermawraps and other suggestions from PT. Per Dr. Shawnie Pons, pt OK to be discharged home with normal renal US. -report given and care transferred to R. Arita Miss, CNM Marylen Ponto, NP  12:49 AM 04/25/2022   Assessment and Plan  Back pain affecting pregnancy in second trimester - Referral to outpatient neurology  Nausea vomiting and diarrhea  [redacted] weeks gestation of pregnancy   - Discharge  patient - Keep scheduled appts with OB and PT providers - Patient verbalized an understanding of the plan of care and agrees.  Raelyn Mora, CNM  04/25/2022 1:28 AM

## 2022-04-25 ENCOUNTER — Inpatient Hospital Stay (HOSPITAL_COMMUNITY): Payer: Commercial Managed Care - PPO

## 2022-04-25 ENCOUNTER — Encounter (HOSPITAL_COMMUNITY): Payer: Self-pay | Admitting: Obstetrics & Gynecology

## 2022-04-25 DIAGNOSIS — M549 Dorsalgia, unspecified: Secondary | ICD-10-CM

## 2022-04-25 DIAGNOSIS — O99891 Other specified diseases and conditions complicating pregnancy: Secondary | ICD-10-CM

## 2022-04-25 DIAGNOSIS — Z3A24 24 weeks gestation of pregnancy: Secondary | ICD-10-CM

## 2022-04-25 MED ORDER — HYDROMORPHONE HCL 1 MG/ML IJ SOLN
1.0000 mg | Freq: Once | INTRAMUSCULAR | Status: AC
  Administered 2022-04-25: 1 mg via INTRAVENOUS
  Filled 2022-04-25: qty 1

## 2022-04-25 NOTE — Discharge Instructions (Addendum)
You will need to increase the times you have physical therapy for your back to see more improvement. We also recommend TENS unit, Voltaren cream, lidocaine patches, thermawraps and other suggestions from PT

## 2022-04-26 LAB — CULTURE, OB URINE

## 2022-05-09 ENCOUNTER — Inpatient Hospital Stay (HOSPITAL_COMMUNITY)
Admission: AD | Admit: 2022-05-09 | Discharge: 2022-05-09 | Disposition: A | Discharge status: Home / Self Care | Payer: Commercial Managed Care - PPO | Attending: Obstetrics & Gynecology | Admitting: Obstetrics & Gynecology

## 2022-05-09 ENCOUNTER — Encounter (HOSPITAL_COMMUNITY): Payer: Self-pay | Admitting: Obstetrics & Gynecology

## 2022-05-09 DIAGNOSIS — Z1152 Encounter for screening for COVID-19: Secondary | ICD-10-CM | POA: Insufficient documentation

## 2022-05-09 DIAGNOSIS — O09522 Supervision of elderly multigravida, second trimester: Secondary | ICD-10-CM | POA: Diagnosis not present

## 2022-05-09 DIAGNOSIS — Z3689 Encounter for other specified antenatal screening: Secondary | ICD-10-CM | POA: Diagnosis not present

## 2022-05-09 DIAGNOSIS — O99512 Diseases of the respiratory system complicating pregnancy, second trimester: Secondary | ICD-10-CM | POA: Diagnosis not present

## 2022-05-09 DIAGNOSIS — R109 Unspecified abdominal pain: Secondary | ICD-10-CM | POA: Diagnosis not present

## 2022-05-09 DIAGNOSIS — R509 Fever, unspecified: Secondary | ICD-10-CM | POA: Insufficient documentation

## 2022-05-09 DIAGNOSIS — J101 Influenza due to other identified influenza virus with other respiratory manifestations: Secondary | ICD-10-CM | POA: Diagnosis not present

## 2022-05-09 DIAGNOSIS — O26892 Other specified pregnancy related conditions, second trimester: Secondary | ICD-10-CM | POA: Insufficient documentation

## 2022-05-09 DIAGNOSIS — Z3A26 26 weeks gestation of pregnancy: Secondary | ICD-10-CM | POA: Diagnosis not present

## 2022-05-09 LAB — URINALYSIS, ROUTINE W REFLEX MICROSCOPIC
Bilirubin Urine: NEGATIVE
Glucose, UA: NEGATIVE mg/dL
Ketones, ur: 80 mg/dL — AB
Leukocytes,Ua: NEGATIVE
Nitrite: NEGATIVE
Protein, ur: NEGATIVE mg/dL
Specific Gravity, Urine: 1.017 (ref 1.005–1.030)
pH: 5 (ref 5.0–8.0)

## 2022-05-09 LAB — RESPIRATORY PANEL BY PCR

## 2022-05-09 LAB — RESP PANEL BY RT-PCR (FLU A&B, COVID) ARPGX2
Influenza A by PCR: POSITIVE — AB
Influenza B by PCR: NEGATIVE
SARS Coronavirus 2 by RT PCR: NEGATIVE

## 2022-05-09 MED ORDER — IBUPROFEN 600 MG PO TABS
600.0000 mg | ORAL_TABLET | Freq: Once | ORAL | Status: AC
  Administered 2022-05-09: 600 mg via ORAL
  Filled 2022-05-09: qty 1

## 2022-05-09 MED ORDER — ONDANSETRON HCL 4 MG PO TABS: 4.0000 mg | ORAL_TABLET | Freq: Three times a day (TID) | ORAL | 0 refills | Status: DC | PRN

## 2022-05-09 MED ORDER — LACTATED RINGERS IV BOLUS
1000.0000 mL | Freq: Once | INTRAVENOUS | Status: AC
  Administered 2022-05-09: 1000 mL via INTRAVENOUS

## 2022-05-09 MED ORDER — OSELTAMIVIR PHOSPHATE 75 MG PO CAPS: 75.0000 mg | ORAL_CAPSULE | Freq: Two times a day (BID) | ORAL | 0 refills | Status: DC

## 2022-05-09 NOTE — Discharge Instructions (Signed)
Ibuprofen 600 mg every 6 hours (don't use in first or third trimester) Tylenol 1000 mg every 6 hours

## 2022-05-09 NOTE — MAU Provider Note (Signed)
History     CSN: [id number]  Arrival date and time: 05/09/22 1659   Event Date/Time   First Provider Initiated Contact with Patient 05/09/22 1802      Chief Complaint  Patient presents with   Fever   Abdominal Pain   Generalized Body Aches   41 y.o. [id number] @26 .0 wks presenting with cough, congestion, fever, and body aches x2 days. Reports highest temp of 102. Took Tylenol about 3 hours ago but states its not helping. Denies SOB or CP. Reports her daughter had a respiratory virus last week. Also reports seeing a small amount of blood on her peripad today. Denies abd pain or ctx. Denies urinary sx. Reports good FM. States her pregnancy has been uncomplicated.   OB History     Gravida  5   Para  2   Term  2   Preterm      AB  1   Living  2      SAB  1   IAB      Ectopic      Multiple  0   Live Births  2           Past Medical History:  Diagnosis Date   Anxiety    Asthma    cellulitis 04/11/2022   right leg   GERD (gastroesophageal reflux disease)    Headache    History of PCOS    Hypertension    Hypothyroidism    2009-2012    Past Surgical History:  Procedure Laterality Date   CESAREAN SECTION N/A 08/04/2017   Procedure: CESAREAN SECTION;  Surgeon: 10/04/2017, MD;  Location: [facility] BIRTHING SUITES;  Service: Obstetrics;  Laterality: N/A;   TYMPANOSTOMY TUBE PLACEMENT      Family History  Problem Relation Age of Onset   Hypertension Mother    Hypertension Maternal Aunt    Diabetes Maternal Aunt    Hypertension Maternal Grandmother    Colon cancer Maternal Grandfather     Social History   Tobacco Use   Smoking status: Never   Smokeless tobacco: Never  Vaping Use   Vaping Use: Never used  Substance Use Topics   Alcohol use: No   Drug use: No    Allergies:  Allergies  Allergen Reactions   Flexeril [Cyclobenzaprine] Other (See Comments)    Headaches, nausea, vomiting   Amoxicillin Nausea And Vomiting and Rash    Medications  Prior to Admission  Medication Sig Dispense Refill Last Dose   albuterol (VENTOLIN HFA) 108 (90 Base) MCG/ACT inhaler Inhale 2 puffs into the lungs every 6 (six) hours as needed for wheezing or shortness of breath.   05/09/2022   labetalol (NORMODYNE) 200 MG tablet Take 200 mg by mouth 2 (two) times daily.   05/09/2022   Multiple Vitamin (MULTIVITAMIN WITH MINERALS) TABS tablet Take 1 tablet by mouth daily.   05/09/2022   pantoprazole (PROTONIX) 20 MG tablet Take 20 mg by mouth daily.      [DISCONTINUED] ondansetron (ZOFRAN) 4 MG tablet Take 4 mg by mouth every 8 (eight) hours as needed for nausea or vomiting.   05/09/2022   azithromycin (ZITHROMAX) 250 MG tablet Take 250 mg by mouth daily. (Patient not taking: Reported on 05/09/2022)   Not Taking   cephALEXin (KEFLEX) 250 MG capsule Take 250 mg by mouth 4 (four) times daily. Finished on 04/21/22 (Patient not taking: Reported on 05/09/2022)   Not Taking    Review of Systems  Constitutional:  Positive for chills  and fever.  HENT:  Positive for congestion.   Respiratory:  Positive for cough. Negative for shortness of breath.   Gastrointestinal:  Negative for abdominal pain.  Genitourinary:  Positive for vaginal bleeding.  Musculoskeletal:  Positive for myalgias.   Physical Exam   Blood pressure (!) 127/52, pulse (!) 106, temperature 98.6 F (37 C), resp. rate 18, height 5\' 9"  (1.753 m), weight 131.3 kg, last menstrual period 10/27/2021, SpO2 96 %, unknown if currently breastfeeding. Patient Vitals for the past 24 hrs:  BP Temp Temp src Pulse Resp SpO2 Height Weight  05/09/22 1933 (!) 127/52 98.6 F (37 C) -- (!) 106 -- -- -- --  05/09/22 1715 127/78 (!) 101.1 F (38.4 C) Oral (!) 109 18 96 % 5\' 9"  (1.753 m) 131.3 kg  05/09/22 1712 -- -- -- -- -- 97 % -- --   Physical Exam Vitals and nursing note reviewed. Exam conducted with a chaperone present.  Constitutional:      General: She is not in acute distress.    Appearance: Normal appearance.   HENT:     Head: Normocephalic and atraumatic.  Cardiovascular:     Rate and Rhythm: Regular rhythm. Tachycardia present.     Heart sounds: Normal heart sounds.  Pulmonary:     Effort: Pulmonary effort is normal. No respiratory distress.     Breath sounds: Normal breath sounds. No stridor. No wheezing, rhonchi or rales.  Abdominal:     Palpations: Abdomen is soft.     Tenderness: There is no abdominal tenderness.  Genitourinary:    Comments: VE: closed/long Musculoskeletal:        General: Normal range of motion.  Skin:    General: Skin is warm and dry.  Neurological:     General: No focal deficit present.     Mental Status: She is alert and oriented to person, place, and time.  Psychiatric:        Mood and Affect: Mood normal.        Behavior: Behavior normal.   EFM: 155 bpm, mod variability, + accels, no decels Toco: none  Results for orders placed or performed during the hospital encounter of 05/09/22 (from the past 24 hour(s))  Resp Panel by RT-PCR (Flu A&B, Covid) Anterior Nasal Swab     Status: Abnormal   Collection Time: 05/09/22  5:44 PM   Specimen: Anterior Nasal Swab  Result Value Ref Range   SARS Coronavirus 2 by RT PCR NEGATIVE NEGATIVE   Influenza A by PCR POSITIVE (A) NEGATIVE   Influenza B by PCR NEGATIVE NEGATIVE  Respiratory (~20 pathogens) panel by PCR     Status: Abnormal   Collection Time: 05/09/22  6:01 PM   Specimen: Nasopharyngeal Swab; Respiratory  Result Value Ref Range   Adenovirus NOT DETECTED NOT DETECTED   Coronavirus 229E NOT DETECTED NOT DETECTED   Coronavirus HKU1 NOT DETECTED NOT DETECTED   Coronavirus NL63 NOT DETECTED NOT DETECTED   Coronavirus OC43 NOT DETECTED NOT DETECTED   Metapneumovirus NOT DETECTED NOT DETECTED   Rhinovirus / Enterovirus NOT DETECTED NOT DETECTED   Influenza A H1 2009 DETECTED (A) NOT DETECTED   Influenza B NOT DETECTED NOT DETECTED   Parainfluenza Virus 1 NOT DETECTED NOT DETECTED   Parainfluenza Virus 2 NOT  DETECTED NOT DETECTED   Parainfluenza Virus 3 NOT DETECTED NOT DETECTED   Parainfluenza Virus 4 NOT DETECTED NOT DETECTED   Respiratory Syncytial Virus NOT DETECTED NOT DETECTED   Bordetella pertussis NOT DETECTED NOT  DETECTED   Bordetella Parapertussis NOT DETECTED NOT DETECTED   Chlamydophila pneumoniae NOT DETECTED NOT DETECTED   Mycoplasma pneumoniae NOT DETECTED NOT DETECTED   MAU Course  Procedures LR Ibuprofen  MDM Labs ordered. +influenza, will treat with Tamiflu. Feels better after fluids and meds. Stable for discharge home.  Assessment and Plan   1. [redacted] weeks gestation of pregnancy   2. NST (non-stress test) reactive   3. Influenza A    Discharge home Follow up at [facility] as scheduled Rx Tamiflu Rx Zofran Return precautions  Allergies as of 05/09/2022       Reactions   Flexeril [cyclobenzaprine] Other (See Comments)   Headaches, nausea, vomiting   Amoxicillin Nausea And Vomiting, Rash        Medication List     STOP taking these medications    azithromycin 250 MG tablet Commonly known as: ZITHROMAX   cephALEXin 250 MG capsule Commonly known as: KEFLEX       TAKE these medications    albuterol 108 (90 Base) MCG/ACT inhaler Commonly known as: VENTOLIN HFA Inhale 2 puffs into the lungs every 6 (six) hours as needed for wheezing or shortness of breath.   labetalol 200 MG tablet Commonly known as: NORMODYNE Take 200 mg by mouth 2 (two) times daily.   multivitamin with minerals Tabs tablet Take 1 tablet by mouth daily.   ondansetron 4 MG tablet Commonly known as: ZOFRAN Take 1 tablet (4 mg total) by mouth every 8 (eight) hours as needed for nausea or vomiting.   oseltamivir 75 MG capsule Commonly known as: TAMIFLU Take 1 capsule (75 mg total) by mouth every 12 (twelve) hours.   pantoprazole 20 MG tablet Commonly known as: PROTONIX Take 20 mg by mouth daily.       Donette Larry, CNM 05/09/2022, 8:42 PM

## 2022-05-09 NOTE — MAU Note (Signed)
Desiree Singh is a 41 y.o. at [redacted]w[redacted]d here in MAU reporting: fever x48hrs, highest 102.7.  severe body aches. Took 500mg  Tylenol at 1500.  Cramping in lower abd Noted blood in her pad prior to coming here.  Has persistent dry cough for months..  +ear ache, no sore throat, +sinus pressure. Daughter had a viral infection with fever last wk, covid, flu and RSV were all neg.  Onset of complaint: 2 days for fever Pain score: body 6, abd 6 Vitals:   05/09/22 1712 05/09/22 1715  BP:  127/78  Pulse:  (!) 109  Resp:  18  Temp:  (!) 101.1 F (38.4 C)  SpO2: 97% 96%      Lab orders placed from triage:

## 2022-07-31 ENCOUNTER — Encounter (HOSPITAL_COMMUNITY): Payer: Self-pay | Admitting: Obstetrics & Gynecology

## 2022-07-31 ENCOUNTER — Inpatient Hospital Stay (HOSPITAL_COMMUNITY): Payer: Commercial Managed Care - PPO

## 2022-07-31 ENCOUNTER — Other Ambulatory Visit: Payer: Self-pay

## 2022-07-31 ENCOUNTER — Inpatient Hospital Stay (HOSPITAL_COMMUNITY)
Admission: AD | Admit: 2022-07-31 | Discharge: 2022-07-31 | Disposition: A | Discharge status: Home / Self Care | Payer: Commercial Managed Care - PPO | Attending: Obstetrics & Gynecology | Admitting: Obstetrics & Gynecology

## 2022-07-31 DIAGNOSIS — K047 Periapical abscess without sinus: Secondary | ICD-10-CM | POA: Diagnosis not present

## 2022-07-31 DIAGNOSIS — Z3A37 37 weeks gestation of pregnancy: Secondary | ICD-10-CM | POA: Diagnosis not present

## 2022-07-31 DIAGNOSIS — O26893 Other specified pregnancy related conditions, third trimester: Secondary | ICD-10-CM | POA: Insufficient documentation

## 2022-07-31 LAB — COMPREHENSIVE METABOLIC PANEL
ALT: 19 U/L (ref 0–44)
AST: 30 U/L (ref 15–41)
Albumin: 2.7 g/dL — ABNORMAL LOW (ref 3.5–5.0)
Alkaline Phosphatase: 92 U/L (ref 38–126)
Anion gap: 9 (ref 5–15)
BUN: 8 mg/dL (ref 6–20)
CO2: 21 mmol/L — ABNORMAL LOW (ref 22–32)
Calcium: 9.2 mg/dL (ref 8.9–10.3)
Chloride: 106 mmol/L (ref 98–111)
Creatinine, Ser: 0.7 mg/dL (ref 0.44–1.00)
GFR, Estimated: >60 mL/min (ref >60)
Glucose, Bld: 108 mg/dL — ABNORMAL HIGH (ref 70–99)
Potassium: 3.8 mmol/L (ref 3.5–5.1)
Sodium: 136 mmol/L (ref 135–145)
Total Bilirubin: 0.5 mg/dL (ref 0.3–1.2)
Total Protein: 6.2 g/dL — ABNORMAL LOW (ref 6.5–8.1)

## 2022-07-31 LAB — CBC WITH DIFFERENTIAL/PLATELET
Abs Immature Granulocytes: 0.1 10*3/uL — ABNORMAL HIGH (ref 0.00–0.07)
Basophils Absolute: 0 10*3/uL (ref 0.0–0.1)
Basophils Relative: 0 %
Eosinophils Absolute: 0.1 10*3/uL (ref 0.0–0.5)
Eosinophils Relative: 1 %
HCT: 34.5 % — ABNORMAL LOW (ref 36.0–46.0)
Hemoglobin: 10.8 g/dL — ABNORMAL LOW (ref 12.0–15.0)
Immature Granulocytes: 1 %
Lymphocytes Relative: 16 %
Lymphs Abs: 1.8 10*3/uL (ref 0.7–4.0)
MCH: 25.5 pg — ABNORMAL LOW (ref 26.0–34.0)
MCHC: 31.3 g/dL (ref 30.0–36.0)
MCV: 81.6 fL (ref 80.0–100.0)
Monocytes Absolute: 0.8 10*3/uL (ref 0.1–1.0)
Monocytes Relative: 7 %
Neutro Abs: 8.5 10*3/uL — ABNORMAL HIGH (ref 1.7–7.7)
Neutrophils Relative %: 75 %
Platelets: 165 10*3/uL (ref 150–400)
RBC: 4.23 MIL/uL (ref 3.87–5.11)
RDW: 15.2 % (ref 11.5–15.5)
WBC: 11.3 10*3/uL — ABNORMAL HIGH (ref 4.0–10.5)
nRBC: 0 % (ref 0.0–0.2)

## 2022-07-31 MED ORDER — TRAMADOL HCL 50 MG PO TABS
50.0000 mg | ORAL_TABLET | Freq: Once | ORAL | Status: AC
  Administered 2022-07-31: 50 mg via ORAL
  Filled 2022-07-31: qty 1

## 2022-07-31 MED ORDER — TRAMADOL HCL 50 MG PO TABS: 50.0000 mg | ORAL_TABLET | Freq: Four times a day (QID) | ORAL | 0 refills | Status: AC | PRN

## 2022-07-31 MED ORDER — IOHEXOL 350 MG/ML SOLN
75.0000 mL | Freq: Once | INTRAVENOUS | Status: AC | PRN
  Administered 2022-07-31: 75 mL via INTRAVENOUS

## 2022-07-31 NOTE — MAU Note (Signed)
.  Desiree Singh is a 42 y.o. at 4w6dhere in MAU reporting: abscessed tooth diagnosed on Friday via virtual visit with her dentist.  Reports she was started on antibiotics.  States pain has gotten worse over night, reports pressure behind her right eye and ear and blurred vision of right eye.  Last took tylenol at 0200 with no relief.  Denies ctx, LOF, or vag bleeding.    Onset of complaint: friday Pain score: 7/10 Vitals:   07/31/22 1056  BP: 126/71  Resp: 17  Temp: 98.3 F (36.8 C)     FHT:135 Lab orders placed from triage:

## 2022-07-31 NOTE — MAU Provider Note (Signed)
History     [id number]  Arrival date and time: 07/31/22 1042    Chief Complaint  Patient presents with   Dental Pain     HPI Desiree Singh is a 42 y.o. at 74w6dwho presents for dental & facial pain. Symptoms started on Friday. States she had a tooth pulled a few weeks ago & had recovered well. On Friday she started having pain at the site next to where her tooth was pulled. She had a virtual appointment with her dentist yesterday, was diagnosed with a dental abscess & started on antibiotics. She has a follow up appointment with her dentist tomorrow morning. States pain is getting worse & spreading through the right side of her face to her right ear & eye. Has been taking tylenol without relief. Denies fever.  No OB complaints. Good fetal movement.   OB History     Gravida  4   Para  2   Term  2   Preterm      AB  1   Living  2      SAB  1   IAB      Ectopic      Multiple  0   Live Births  2           Past Medical History:  Diagnosis Date   Anxiety    Asthma    cellulitis 04/11/2022   right leg   GERD (gastroesophageal reflux disease)    Headache    History of PCOS    Hypertension    Hypothyroidism    2009-2012    Past Surgical History:  Procedure Laterality Date   CESAREAN SECTION N/A 08/04/2017   Procedure: CESAREAN SECTION;  Surgeon: TEverlene Farrier MD;  [place]  Service: Obstetrics;  Laterality: N/A;   TYMPANOSTOMY TUBE PLACEMENT      Family History  Problem Relation Age of Onset   Hypertension Mother    Hypertension Maternal Aunt    Diabetes Maternal Aunt    Hypertension Maternal Grandmother    Colon cancer Maternal Grandfather     Allergies  Allergen Reactions   Flexeril [Cyclobenzaprine] Other (See Comments)    Headaches, nausea, vomiting   Amoxicillin Nausea And Vomiting and Rash    No current facility-administered medications on file prior to encounter.   Current Outpatient Medications on File Prior  to Encounter  Medication Sig Dispense Refill   azithromycin (ZITHROMAX) 1 g powder Take 1 g by mouth once.     ferrous sulfate 325 (65 FE) MG EC tablet Take 325 mg by mouth 3 (three) times daily with meals.     glucose blood test strip 4 (four) times daily.     labetalol (NORMODYNE) 200 MG tablet Take 200 mg by mouth 2 (two) times daily.     Lancets (ONETOUCH DELICA PLUS [id number] MISC      pantoprazole (PROTONIX) 20 MG tablet Take 20 mg by mouth daily.     Prenatal Vit-Fe Fumarate-FA (MULTIVITAMIN-PRENATAL) 27-0.8 MG TABS tablet Take 1 tablet by mouth daily at 12 noon.     albuterol (VENTOLIN HFA) 108 (90 Base) MCG/ACT inhaler Inhale 2 puffs into the lungs every 6 (six) hours as needed for wheezing or shortness of breath.     ondansetron (ZOFRAN) 4 MG tablet Take 1 tablet (4 mg total) by mouth every 8 (eight) hours as needed for nausea or vomiting. 20 tablet 0     ROS Pertinent positives and negative per HPI, all others  reviewed and negative  Physical Exam   BP 138/78   Pulse 74   Temp 98.3 F (36.8 C) (Oral)   Resp 17   LMP 10/27/2021 (Approximate)   Patient Vitals for the past 24 hrs:  BP Temp Temp src Pulse Resp  07/31/22 1537 138/78 -- -- 74 --  07/31/22 1111 (!) 110/59 -- -- 78 --  07/31/22 1103 -- -- -- 93 --  07/31/22 1056 126/71 98.3 F (36.8 C) Oral -- 17    Physical Exam Vitals and nursing note reviewed.  Constitutional:      General: She is not in acute distress.    Appearance: She is well-developed. She is not ill-appearing.  HENT:     Head: Normocephalic and atraumatic.     Salivary Glands: Right salivary gland is not diffusely enlarged. Left salivary gland is not diffusely enlarged.     Right Ear: No mastoid tenderness.     Left Ear: No mastoid tenderness.     Ears:     Comments: Right tragus TTP    Nose:     Right Sinus: Maxillary sinus tenderness present. No frontal sinus tenderness.     Left Sinus: No maxillary sinus tenderness or frontal sinus  tenderness.     Mouth/Throat:     Dentition: Gingival swelling and gum lesions present. No dental abscesses.     Comments: Area above right upper molar red & ulcerated. Right buccal mucosa TTP. No masses palpable.  Eyes:     General: No scleral icterus.       Right eye: No discharge.        Left eye: No discharge.     Conjunctiva/sclera: Conjunctivae normal.  Pulmonary:     Effort: Pulmonary effort is normal. No respiratory distress.  Lymphadenopathy:     Cervical: No cervical adenopathy.  Skin:    General: Skin is warm and dry.  Neurological:     General: No focal deficit present.     Mental Status: She is alert.  Psychiatric:        Mood and Affect: Mood normal.        Behavior: Behavior normal.     FHT Baseline 120, moderate variability, 15x15 accels, no decels Toco: none Cat: 1  Labs Results for orders placed or performed during the hospital encounter of 07/31/22 (from the past 24 hour(s))  CBC with Differential/Platelet     Status: Abnormal   Collection Time: 07/31/22 12:10 PM  Result Value Ref Range   WBC 11.3 (H) 4.0 - 10.5 K/uL   RBC 4.23 3.87 - 5.11 MIL/uL   Hemoglobin 10.8 (L) 12.0 - 15.0 g/dL   HCT 34.5 (L) 36.0 - 46.0 %   MCV 81.6 80.0 - 100.0 fL   MCH 25.5 (L) 26.0 - 34.0 pg   MCHC 31.3 30.0 - 36.0 g/dL   RDW 15.2 11.5 - 15.5 %   Platelets 165 150 - 400 K/uL   nRBC 0.0 0.0 - 0.2 %   Neutrophils Relative % 75 %   Neutro Abs 8.5 (H) 1.7 - 7.7 K/uL   Lymphocytes Relative 16 %   Lymphs Abs 1.8 0.7 - 4.0 K/uL   Monocytes Relative 7 %   Monocytes Absolute 0.8 0.1 - 1.0 K/uL   Eosinophils Relative 1 %   Eosinophils Absolute 0.1 0.0 - 0.5 K/uL   Basophils Relative 0 %   Basophils Absolute 0.0 0.0 - 0.1 K/uL   Immature Granulocytes 1 %   Abs Immature Granulocytes 0.10 (H)  0.00 - 0.07 K/uL  Comprehensive metabolic panel     Status: Abnormal   Collection Time: 07/31/22 12:10 PM  Result Value Ref Range   Sodium 136 135 - 145 mmol/L   Potassium 3.8 3.5 - 5.1  mmol/L   Chloride 106 98 - 111 mmol/L   CO2 21 (L) 22 - 32 mmol/L   Glucose, Bld 108 (H) 70 - 99 mg/dL   BUN 8 6 - 20 mg/dL   Creatinine, Ser 0.70 0.44 - 1.00 mg/dL   Calcium 9.2 8.9 - 10.3 mg/dL   Total Protein 6.2 (L) 6.5 - 8.1 g/dL   Albumin 2.7 (L) 3.5 - 5.0 g/dL   AST 30 15 - 41 U/L   ALT 19 0 - 44 U/L   Alkaline Phosphatase 92 38 - 126 U/L   Total Bilirubin 0.5 0.3 - 1.2 mg/dL   GFR, Estimated >60 >60 mL/min   Anion gap 9 5 - 15    Imaging CT MAXILLOFACIAL W CONTRAST  Result Date: 07/31/2022 CLINICAL DATA:  Dental abscess.  Facial pain. EXAM: CT MAXILLOFACIAL WITH CONTRAST TECHNIQUE: Multidetector CT imaging of the maxillofacial structures was performed with intravenous contrast. Multiplanar CT image reconstructions were also generated. RADIATION DOSE REDUCTION: This exam was performed according to the departmental dose-optimization program which includes automated exposure control, adjustment of the mA and/or kV according to patient size and/or use of iterative reconstruction technique. CONTRAST:  54m OMNIPAQUE IOHEXOL 350 MG/ML SOLN COMPARISON:  None Available. FINDINGS: Osseous: No fracture or mandibular dislocation. There is erosive change of the floor 2 of the right maxillary sinus new lesion of the right maxillary molar (series 10, image 33). No residual dentition is visualized in this region. In the region of the right maxillary molar there is a soft tissue that extends into the right maxillary sinus (series 10, image 34). Orbits: Negative. No traumatic or inflammatory finding. Sinuses: There is soft tissue thickening of the floor of the right maxillary sinus. Paranasal sinuses are otherwise clear. Soft tissues: No evidence of subcutaneous soft tissue stranding or a rim enhancing fluid collection to suggest an odontogenic abscess. Limited intracranial: No significant or unexpected finding. IMPRESSION: 1. No evidence of subcutaneous soft tissue stranding or a rim enhancing fluid  collection to suggest an odontogenic abscess. 2. There is erosive change of the floor of the right maxillary sinus with soft tissue extending into the right maxillary sinus. Correlation with direct visualization is recommended to assess for a soft tissue mass. Electronically Signed   By: HMarin RobertsM.D.   On: 07/31/2022 14:47    MAU Course  Procedures Lab Orders         CBC with Differential/Platelet         Comprehensive metabolic panel     Meds ordered this encounter  Medications   traMADol (ULTRAM) tablet 50 mg   iohexol (OMNIPAQUE) 350 MG/ML injection 75 mL   traMADol (ULTRAM) 50 MG tablet    Sig: Take 1 tablet (50 mg total) by mouth every 6 (six) hours as needed for up to 5 days for severe pain.    Dispense:  20 tablet    Refill:  0    Order Specific Question:   Supervising Provider    Answer:   EFlorian Buff[[id number]]   Imaging Orders         CT MAXILLOFACIAL W CONTRAST      MDM Patient afebrile. No palpable masses on exam. White count appropriate for pregnancy.  Pain improved  with tramadol.  CT shows evidence of abscess, soft tissue extending to right maxillary sinus No abscesses or masses appreciated by my exam nor Dr. Erskine Speed Lott's exam. Patient is stable for discharge home & has close follow up.  Assessment and Plan   1. Dental infection   2. [redacted] weeks gestation of pregnancy    -Rx tramadol #20 -Reviewed signs of worsening infection & reasons to return to care -Patient has appointment with dentist in the morning   Jorje Guild, NP 07/31/22 4:21 PM

## 2022-07-31 NOTE — Progress Notes (Signed)
Asked by MAU provider to review CT images. Infection appears to have spread to right maxillary sinus. On exam I appreciate right upper maxillary gum irritation but there is no fluctuant mass felt. She is tender to palpation in this area. She is afebrile with normal white count and currently on day 4 of 5 azithromycin. Urged her to keep dental appointment and finish antibiotics.   Gerlene Fee, DO OB Fellow, [place] [facility] [employer] 07/31/2022, 6:11 PM

## 2022-07-31 NOTE — Discharge Instructions (Signed)
Emergency Dentists   [place] Office [phone] [address], [place], [place] [address] [phone]   [place], [place] [facility] Available [address] [place], [place] [address] Call [facility] 24/7 Office Number: [phone]   [facility] [address], [place], [place] [address], [place] [phone]

## 2023-01-05 ENCOUNTER — Other Ambulatory Visit: Payer: Self-pay | Admitting: Family Medicine

## 2023-01-05 DIAGNOSIS — H538 Other visual disturbances: Secondary | ICD-10-CM

## 2023-01-29 ENCOUNTER — Other Ambulatory Visit: Payer: Self-pay

## 2023-01-29 ENCOUNTER — Emergency Department (HOSPITAL_BASED_OUTPATIENT_CLINIC_OR_DEPARTMENT_OTHER)
Admission: EM | Admit: 2023-01-29 | Discharge: 2023-01-29 | Disposition: A | Discharge status: Home / Self Care | Payer: Commercial Managed Care - PPO | Attending: Emergency Medicine | Admitting: Emergency Medicine

## 2023-01-29 ENCOUNTER — Encounter (HOSPITAL_BASED_OUTPATIENT_CLINIC_OR_DEPARTMENT_OTHER): Payer: Self-pay | Admitting: Emergency Medicine

## 2023-01-29 DIAGNOSIS — Z794 Long term (current) use of insulin: Secondary | ICD-10-CM | POA: Insufficient documentation

## 2023-01-29 DIAGNOSIS — J45909 Unspecified asthma, uncomplicated: Secondary | ICD-10-CM | POA: Insufficient documentation

## 2023-01-29 DIAGNOSIS — Z79899 Other long term (current) drug therapy: Secondary | ICD-10-CM | POA: Insufficient documentation

## 2023-01-29 DIAGNOSIS — I10 Essential (primary) hypertension: Secondary | ICD-10-CM | POA: Diagnosis not present

## 2023-01-29 DIAGNOSIS — R519 Headache, unspecified: Secondary | ICD-10-CM | POA: Diagnosis present

## 2023-01-29 DIAGNOSIS — G43809 Other migraine, not intractable, without status migrainosus: Secondary | ICD-10-CM | POA: Diagnosis not present

## 2023-01-29 MED ORDER — SODIUM CHLORIDE 0.9 % IV BOLUS
1000.0000 mL | Freq: Once | INTRAVENOUS | Status: AC
  Administered 2023-01-29: 1000 mL via INTRAVENOUS

## 2023-01-29 MED ORDER — KETOROLAC TROMETHAMINE 15 MG/ML IJ SOLN
15.0000 mg | Freq: Once | INTRAMUSCULAR | Status: AC
  Administered 2023-01-29: 15 mg via INTRAVENOUS
  Filled 2023-01-29: qty 1

## 2023-01-29 MED ORDER — DIPHENHYDRAMINE HCL 50 MG/ML IJ SOLN
25.0000 mg | Freq: Once | INTRAMUSCULAR | Status: AC
  Administered 2023-01-29: 25 mg via INTRAVENOUS
  Filled 2023-01-29: qty 1

## 2023-01-29 MED ORDER — DEXAMETHASONE SODIUM PHOSPHATE 10 MG/ML IJ SOLN
10.0000 mg | Freq: Once | INTRAMUSCULAR | Status: AC
  Administered 2023-01-29: 10 mg via INTRAVENOUS
  Filled 2023-01-29: qty 1

## 2023-01-29 MED ORDER — METOCLOPRAMIDE HCL 5 MG/ML IJ SOLN
10.0000 mg | Freq: Once | INTRAMUSCULAR | Status: AC
  Administered 2023-01-29: 10 mg via INTRAVENOUS
  Filled 2023-01-29: qty 2

## 2023-01-29 NOTE — Discharge Instructions (Addendum)
We gave you medications to treat your headache.  You should not drive for approximately 6 hours after receiving these medications as they can make you sleepy.  Follow-up with your PCP to discuss better control of your headaches and obtain the CT scan that was ordered.  Also be sure to measure your blood pressure daily, keep a log of this, and keep taking your blood pressure medication as prescribed by your outpatient team.  If despite better blood pressure control, you continue to have frequent headaches as discussed I believe you would benefit from follow-up with neurology.  I provided the 2 local neurology clinics that you may call to schedule follow-up if you desire.  Your PCP may also initiate medications to control your chronic headaches if indicated or medications to take when you have a severe headache that can help improve it.  For new or worsening symptoms, return to the nearest ED for reevaluation.

## 2023-01-29 NOTE — ED Triage Notes (Signed)
Headache/migraine. Started Wednesday worse today, light sensitive with nausea No relief with otc pain meds

## 2023-01-29 NOTE — ED Provider Notes (Signed)
[place] EMERGENCY DEPARTMENT AT [facility] Provider Note   CSN: [id number] Arrival date & time: 01/29/23  1701     History  Chief Complaint  Patient presents with   Headache    Desiree Singh is a 42 y.o. female with past medical history asthma, GERD, migraine headaches, hypertension, thyroid disease who presents to the ED with a severe headache.  Notes that it is bilateral frontotemporal and radiates to the back of the head and down into the neck.  No separate neck pain.  No fever, no confusion.  No recent cough, congestion, vomiting, or diarrhea.  Associated photophobia and nausea.  No visual changes.  Has tried ibuprofen at home without relief.  Has a longstanding history of migraine headaches and has trialed multiple medications before both preventative and breakthrough though with difficulty with side effects that is not currently on any.  No chest pain or shortness of breath.  No known sick contacts.  No change in gait or focal weakness.  No head injury.  Denies chance of pregnancy.  Has had unremarkable imaging of the head before though it has been many years.  Was also previously followed by neurology though not currently.       Home Medications Prior to Admission medications   Medication Sig Start Date End Date Taking? Authorizing Provider  albuterol (VENTOLIN HFA) 108 (90 Base) MCG/ACT inhaler Inhale 2 puffs into the lungs every 6 (six) hours as needed for wheezing or shortness of breath.    [provider]  azithromycin (ZITHROMAX) 1 g powder Take 1 g by mouth once.    [provider]  ferrous sulfate 325 (65 FE) MG EC tablet Take 325 mg by mouth 3 (three) times daily with meals.    [provider]  glucose blood test strip 4 (four) times daily. 07/13/22   [provider]  labetalol (NORMODYNE) 200 MG tablet Take 200 mg by mouth 2 (two) times daily.    [provider]  Lancets [facility] DELICA PLUS LANCET30G) MISC   07/25/22   [provider]  ondansetron (ZOFRAN) 4 MG tablet Take 1 tablet (4 mg total) by mouth every 8 (eight) hours as needed for nausea or vomiting. 05/09/22   Donette Larry, CNM  pantoprazole (PROTONIX) 20 MG tablet Take 20 mg by mouth daily.    [provider]  Prenatal Vit-Fe Fumarate-FA (MULTIVITAMIN-PRENATAL) 27-0.8 MG TABS tablet Take 1 tablet by mouth daily at 12 noon.    [provider]      Allergies    Flexeril [cyclobenzaprine] and Amoxicillin    Review of Systems   Review of Systems  All other systems reviewed and are negative.   Physical Exam Updated Vital Signs BP (!) 159/99 (BP Location: Right Arm)   Pulse 85   Temp 98.5 F (36.9 C) (Oral)   LMP 12/23/2022   SpO2 97%   Breastfeeding No  Physical Exam Vitals and nursing note reviewed.  Constitutional:      General: She is not in acute distress.    Appearance: Normal appearance. She is not ill-appearing, toxic-appearing or diaphoretic.  HENT:     Head: Normocephalic and atraumatic.     Mouth/Throat:     Mouth: Mucous membranes are moist.  Eyes:     General: No scleral icterus.    Extraocular Movements: Extraocular movements intact.     Right eye: No nystagmus.     Left eye: No nystagmus.     Conjunctiva/sclera: Conjunctivae normal.  Pupils: Pupils are equal, round, and reactive to light.  Neck:     Meningeal: Brudzinski's sign and Kernig's sign absent.  Cardiovascular:     Rate and Rhythm: Normal rate and regular rhythm.     Heart sounds: No murmur heard. Pulmonary:     Effort: Pulmonary effort is normal.     Breath sounds: Normal breath sounds.  Abdominal:     General: Abdomen is flat. There is no distension.     Palpations: Abdomen is soft.     Tenderness: There is no abdominal tenderness.  Musculoskeletal:        General: Normal range of motion.     Cervical back: Normal range of motion and neck supple. No rigidity.     Right lower leg: No edema.     Left lower  leg: No edema.     Comments: No midline CTL spinal tenderness, step-offs, deformities  Skin:    General: Skin is warm and dry.     Capillary Refill: Capillary refill takes less than 2 seconds.     Findings: No rash.  Neurological:     General: No focal deficit present.     Mental Status: She is alert and oriented to person, place, and time.     GCS: GCS eye subscore is 4. GCS verbal subscore is 5. GCS motor subscore is 6.     Cranial Nerves: No cranial nerve deficit, dysarthria or facial asymmetry.     Sensory: Sensation is intact.     Motor: Motor function is intact. No weakness, tremor, atrophy or abnormal muscle tone.     Coordination: Coordination is intact.     Deep Tendon Reflexes:     Reflex Scores:      Patellar reflexes are 2+ on the right side and 2+ on the left side. Psychiatric:        Mood and Affect: Mood normal.        Speech: Speech normal.        Behavior: Behavior normal. Behavior is cooperative.     ED Results / Procedures / Treatments   Labs (all labs ordered are listed, but only abnormal results are displayed) Labs Reviewed - No data to display  EKG None  Radiology No results found.  Procedures Procedures    Medications Ordered in ED Medications  ketorolac (TORADOL) 15 MG/ML injection 15 mg (15 mg Intravenous Given 01/29/23 1821)  metoCLOPramide (REGLAN) injection 10 mg (10 mg Intravenous Given 01/29/23 1824)  dexamethasone (DECADRON) injection 10 mg (10 mg Intravenous Given 01/29/23 1826)  diphenhydrAMINE (BENADRYL) injection 25 mg (25 mg Intravenous Given 01/29/23 1823)  sodium chloride 0.9 % bolus 1,000 mL (1,000 mLs Intravenous New Bag/Given 01/29/23 1819)    ED Course/ Medical Decision Making/ A&P Clinical Course as of 01/29/23 1856  Sun Jan 29, 2023  5784 Patient reports improving headache following migraine cocktail.  Remains neurologically intact.  Again does not desire further workup.  PCP has ordered outpatient CT scan due to poorly  controlled hypertension.  This also could be contributing to patient's frequent headaches.  She is adherent with medication regimen and is being closely followed by PCP.  With frequent headaches, also believe that she would benefit from neurology follow-up.  Will provide with local clinics and encourage close follow-up with neurology should better blood pressure control not lessen the frequency of her headaches and she is agreeable.  States that with improvement in her headaches, she feels comfortable being discharged home.  Discussed strict ED  return precautions and patient agreeable to return if needed. [MG]    Clinical Course User Index [MG] Tonette Lederer, PA-C                                 Medical Decision Making Risk Prescription drug management.   Medical Decision Making:   Makinley Mccalman is a 42 y.o. female who presented to the ED today with headache detailed above.    Additional history discussed with patient's family/caregivers.  Patient's presentation is complicated by their history of hypertension, obesity, thyroid disease, migraine headaches.  Complete initial physical exam performed, notably the patient was in no acute distress.  Neurologically intact.  No meningismus.  Well-appearing.    Reviewed and confirmed nursing documentation for past medical history, family history, social history.    Initial Assessment:   With the patient's presentation of headache, differential diagnosis includes but is not limited to emergent considerations for headache include subarachnoid hemorrhage, meningitis, temporal arteritis, glaucoma, cerebral ischemia, carotid/vertebral dissection, intracranial tumor, venous sinus thrombosis, carbon monoxide poisoning, acute or chronic subdural hemorrhage.  Other considerations include: migraine, cluster headache, hypertension, caffeine, alcohol, or drug withdrawal, pseudotumor cerebri, arteriovenous malformation, head injury, neurocysticercosis, post-lumbar  puncture, preeclampsia in those assigned female at birth and in reproductive age, tension headache, viral vs acute bacterial sinusitis, cervical arthritis, refractive error causing strain, temporomandibular joint syndrome, depression, somatoform disorder (eg, somatization), trigeminal neuralgia, glossopharyngeal neuralgia.  This is most consistent with an acute complicated illness  Initial Plan:  Given slight change in characterization of headache and remote labs/imaging, offered further workup today the patient declines in favor of symptomatic management with headache cocktail Symptomatic management Objective evaluation as below reviewed   Initial Study Results:   Laboratory  Pt declined on initial exam  Radiology:  Pt declined on initial exam    Final Assessment and Plan:   42 year old female presents to the ED with severe headache.  History of migraines, not currently on any medications.  Neurologically intact.  Nontoxic-appearing, afebrile, no meningismus.  Low suspicion for infectious process.  No focal deficits on exam.  Low suspicion for intracranial process.  Does have a history of poorly controlled hypertension which could be contributing.  Discussion on risk/benefits of obtaining labs/imaging with patient and she would like to forego this at this time in favor of symptomatic treatment which I do believe is reasonable due to her history of migraine headaches.  No recent head trauma.  No midline spinal tenderness.  Headache cocktail and fluids ordered.  Following these medications, patient with improving headache.  She feels comfortable being discharged home.  Will provide with neurology follow-up.  She has outpatient CT ordered by her PCP that she is in the process of arranging.  Remains neurologically intact.  Given strict ED return precautions, all questions answered, and stable for discharge.   Clinical Impression:  1. Other migraine without status migrainosus, not intractable       Data Unavailable           Final Clinical Impression(s) / ED Diagnoses Final diagnoses:  Other migraine without status migrainosus, not intractable    Rx / DC Orders ED Discharge Orders     None         Richardson Dopp 01/29/23 1858    Pollyann Savoy, MD 01/29/23 [phone]

## 2023-05-18 ENCOUNTER — Ambulatory Visit (INDEPENDENT_AMBULATORY_CARE_PROVIDER_SITE_OTHER): Payer: Commercial Managed Care - PPO | Admitting: Neurology

## 2023-05-18 VITALS — BP 140/90 | HR 89

## 2023-05-18 DIAGNOSIS — G43709 Chronic migraine without aura, not intractable, without status migrainosus: Secondary | ICD-10-CM | POA: Insufficient documentation

## 2023-05-18 MED ORDER — ONDANSETRON 4 MG PO TBDP: 4.0000 mg | ORAL_TABLET | Freq: Three times a day (TID) | ORAL | 6 refills | Status: DC | PRN

## 2023-05-18 MED ORDER — RIZATRIPTAN BENZOATE 10 MG PO TBDP: 10.0000 mg | ORAL_TABLET | ORAL | 6 refills | Status: AC | PRN

## 2023-05-18 NOTE — Patient Instructions (Signed)
Meds ordered this encounter  Medications   rizatriptan (MAXALT-MLT) 10 MG disintegrating tablet    Sig: Take 1 tablet (10 mg total) by mouth as needed. May repeat in 2 hours if needed    Dispense:  12 tablet    Refill:  6   ondansetron (ZOFRAN-ODT) 4 MG disintegrating tablet    Sig: Take 1 tablet (4 mg total) by mouth every 8 (eight) hours as needed for nausea or vomiting.    Dispense:  20 tablet    Refill:  6    3. Aleve

## 2023-05-18 NOTE — Progress Notes (Signed)
Chief Complaint  Patient presents with   Migraine    Rm 15 alone Pt is well, reports she has has had migraines for bout 5 yrs. She has them about twice a week. Associated vision disturbance, dizziness, sensitivity to light.       ASSESSMENT AND PLAN  Desiree Singh is a 42 y.o. female   Chronic migraine headache  Maxalt as needed, may combine Zofran, Aleve for prolonged severe headaches,  She does not want to start preventive medication at this time  Return To Clinic With NP In 6 Months virtual visit  DIAGNOSTIC DATA (LABS, IMAGING, TESTING) - I reviewed patient records, labs, notes, testing and imaging myself where available.   MEDICAL HISTORY:  Desiree Singh is a 42 year old female, seen in request by her primary care from [facility], [place] for evaluation of frequent headaches, initial 05/26/2023  History is obtained from the patient and review of electronic medical records. I personally reviewed pertinent available imaging films in PACS.   PMHx of  HTN  She had long history of migraine, mother suffered migraine 2, gradually getting worse over the past few months, now couple times each week, typical migraine starting at occipital of bilateral retro-orbital region, pressure, pounding headache with light noise sensitivity, occasionally nausea, sometimes proceeding by visual aura, such as progressing light in her visual field, her headache typically last for hours or even longer  She tried over-the-counter ibuprofen, Tylenol with limited help  Trigger for her migraine weather change, stress, missing the meal, dehydration,  She did have some weight gain following her recent childbirth about a year ago, recent eye examination was 2 months ago, no significant abnormality found,    PHYSICAL EXAM:   Vitals:   05/18/23 0815  BP: (!) 140/90  Pulse: 89   Not recorded     There is no height or weight on file to calculate BMI.  PHYSICAL EXAMNIATION:  Gen:  NAD, conversant, well nourised, well groomed                     Cardiovascular: Regular rate rhythm, no peripheral edema, warm, nontender. Eyes: Conjunctivae clear without exudates or hemorrhage Neck: Supple, no carotid bruits. Pulmonary: Clear to auscultation bilaterally   NEUROLOGICAL EXAM:  MENTAL STATUS: Speech/cognition: Awake, alert, oriented to history taking and casual conversation CRANIAL NERVES: CN II: Visual fields are full to confrontation. Pupils are round equal and briskly reactive to light.  Funduscopy examinations were normal CN III, IV, VI: extraocular movement are normal. No ptosis. CN V: Facial sensation is intact to light touch CN VII: Face is symmetric with normal eye closure  CN VIII: Hearing is normal to causal conversation. CN IX, X: Phonation is normal. CN XI: Head turning and shoulder shrug are intact  MOTOR: There is no pronator drift of out-stretched arms. Muscle bulk and tone are normal. Muscle strength is normal.  REFLEXES: Reflexes are 2+ and symmetric at the biceps, triceps, knees, and ankles. Plantar responses are flexor.  SENSORY: Intact to light touch, pinprick and vibratory sensation are intact in fingers and toes.  COORDINATION: There is no trunk or limb dysmetria noted.  GAIT/STANCE: Posture is normal. Gait is steady with normal steps, base, arm swing, and turning. Heel and toe walking are normal. Tandem gait is normal.  Romberg is absent.  REVIEW OF SYSTEMS:  Full 14 system review of systems performed and notable only for as above All other review of systems were negative.   ALLERGIES: Allergies  Allergen Reactions   Flexeril [Cyclobenzaprine] Other (See Comments)    Headaches, nausea, vomiting   Amoxicillin Nausea And Vomiting and Rash    HOME MEDICATIONS: Current Outpatient Medications  Medication Sig Dispense Refill   albuterol (VENTOLIN HFA) 108 (90 Base) MCG/ACT inhaler Inhale 2 puffs into the lungs every 6 (six) hours  as needed for wheezing or shortness of breath.     hydrochlorothiazide (HYDRODIURIL) 12.5 MG tablet Take 12.5 mg by mouth every morning.     labetalol (NORMODYNE) 200 MG tablet Take 200 mg by mouth 2 (two) times daily.     No current facility-administered medications for this visit.    PAST MEDICAL HISTORY: Past Medical History:  Diagnosis Date   Anxiety    Asthma    cellulitis 04/11/2022   right leg   GERD (gastroesophageal reflux disease)    Headache    History of PCOS    Hypertension    Hypothyroidism    2009-2012    PAST SURGICAL HISTORY: Past Surgical History:  Procedure Laterality Date   CESAREAN SECTION N/A 08/04/2017   Procedure: CESAREAN SECTION;  Surgeon: Harold Hedge, MD;  Location: [facility] BIRTHING SUITES;  Service: Obstetrics;  Laterality: N/A;   TYMPANOSTOMY TUBE PLACEMENT      FAMILY HISTORY: Family History  Problem Relation Age of Onset   Hypertension Mother    Hypertension Maternal Aunt    Diabetes Maternal Aunt    Hypertension Maternal Grandmother    Colon cancer Maternal Grandfather     SOCIAL HISTORY: Social History   Socioeconomic History   Marital status: Married    Spouse name: Not on file   Number of children: Not on file   Years of education: Not on file   Highest education level: Not on file  Occupational History   Not on file  Tobacco Use   Smoking status: Never   Smokeless tobacco: Never  Vaping Use   Vaping status: Never Used  Substance and Sexual Activity   Alcohol use: No   Drug use: No   Sexual activity: Yes    Birth control/protection: None  Other Topics Concern   Not on file  Social History Narrative   Not on file   Social Drivers of Health   Financial Resource Strain: Low Risk  (08/06/2022)   Received from [facility]   Overall Financial Resource Strain (CARDIA)    Difficulty of Paying Living Expenses: Not hard at all  Food Insecurity: Not on file  Transportation Needs: Unknown (08/06/2022)   Received from [facility] - Transportation    Lack of Transportation (Medical): Not on file    Lack of Transportation (Non-Medical): No  Physical Activity: Not on file  Stress: No Stress Concern Present (08/06/2022)   Received from [facility] - Occupational Stress Questionnaire    Feeling of Stress : Only a little  Social Connections: Unknown (10/19/2021)   Received from [facility]   Social Network    Social Network: Not on file  Intimate Partner Violence: Unknown (09/10/2021)   Received from [facility]   HITS    Physically Hurt: Not on file    Insult or Talk Down To: Not on file    Threaten Physical Harm: Not on file    Scream or Curse: Not on file      Levert Feinstein, M.D. Ph.D.  [facility] [address] [place], [place] [address] Ph: (  [phone] Fax: [phone]  CC:  Tonette Lederer, PA-C No address on file  Jarrett Soho, PA-C

## 2023-11-15 NOTE — Progress Notes (Deleted)
 No chief complaint on file.     ASSESSMENT AND PLAN  Desiree Singh is a 43 y.o. female   Chronic migraine headache  Maxalt  as needed, may combine Zofran , Aleve  for prolonged severe headaches,  She does not want to start preventive medication at this time  Return To Clinic With NP In 6 Months virtual visit  DIAGNOSTIC DATA (LABS, IMAGING, TESTING) - I reviewed patient records, labs, notes, testing and imaging myself where available.   MEDICAL HISTORY:   Update 11/15/2023 JM: Patient returns for follow-up visit via MyChart video visit.       Consult visit 05/26/2023 Dr. Gracie Lav: Desiree Singh is a 43 year old female, seen in request by her primary care from [facility] Clotilde Danker, [place] for evaluation of frequent headaches, initial 05/26/2023  History is obtained from the patient and review of electronic medical records. I personally reviewed pertinent available imaging films in PACS.   PMHx of  HTN  She had long history of migraine, mother suffered migraine 2, gradually getting worse over the past few months, now couple times each week, typical migraine starting at occipital of bilateral retro-orbital region, pressure, pounding headache with light noise sensitivity, occasionally nausea, sometimes proceeding by visual aura, such as progressing light in her visual field, her headache typically last for hours or even longer  She tried over-the-counter ibuprofen , Tylenol  with limited help  Trigger for her migraine weather change, stress, missing the meal, dehydration,  She did have some weight gain following her recent childbirth about a year ago, recent eye examination was 2 months ago, no significant abnormality found,    PHYSICAL EXAM:   There were no vitals filed for this visit.  Not recorded     There is no height or weight on file to calculate BMI.  PHYSICAL EXAMNIATION:  Gen: NAD, conversant, well nourised, well groomed                     Cardiovascular:  Regular rate rhythm, no peripheral edema, warm, nontender. Eyes: Conjunctivae clear without exudates or hemorrhage Neck: Supple, no carotid bruits. Pulmonary: Clear to auscultation bilaterally   NEUROLOGICAL EXAM:  MENTAL STATUS: Speech/cognition: Awake, alert, oriented to history taking and casual conversation CRANIAL NERVES: CN II: Visual fields are full to confrontation. Pupils are round equal and briskly reactive to light.  Funduscopy examinations were normal CN III, IV, VI: extraocular movement are normal. No ptosis. CN V: Facial sensation is intact to light touch CN VII: Face is symmetric with normal eye closure  CN VIII: Hearing is normal to causal conversation. CN IX, X: Phonation is normal. CN XI: Head turning and shoulder shrug are intact  MOTOR: There is no pronator drift of out-stretched arms. Muscle bulk and tone are normal. Muscle strength is normal.  REFLEXES: Reflexes are 2+ and symmetric at the biceps, triceps, knees, and ankles. Plantar responses are flexor.  SENSORY: Intact to light touch, pinprick and vibratory sensation are intact in fingers and toes.  COORDINATION: There is no trunk or limb dysmetria noted.  GAIT/STANCE: Posture is normal. Gait is steady with normal steps, base, arm swing, and turning. Heel and toe walking are normal. Tandem gait is normal.  Romberg is absent.  REVIEW OF SYSTEMS:  Full 14 system review of systems performed and notable only for as above All other review of systems were negative.   ALLERGIES: Allergies  Allergen Reactions   Flexeril  [Cyclobenzaprine ] Other (See Comments)    Headaches, nausea, vomiting   Amoxicillin Nausea  And Vomiting and Rash    HOME MEDICATIONS: Current Outpatient Medications  Medication Sig Dispense Refill   albuterol  (VENTOLIN  HFA) 108 (90 Base) MCG/ACT inhaler Inhale 2 puffs into the lungs every 6 (six) hours as needed for wheezing or shortness of breath.     hydrochlorothiazide   (HYDRODIURIL ) 12.5 MG tablet Take 12.5 mg by mouth every morning.     labetalol  (NORMODYNE ) 200 MG tablet Take 200 mg by mouth 2 (two) times daily.     ondansetron  (ZOFRAN -ODT) 4 MG disintegrating tablet Take 1 tablet (4 mg total) by mouth every 8 (eight) hours as needed for nausea or vomiting. 20 tablet 6   rizatriptan  (MAXALT -MLT) 10 MG disintegrating tablet Take 1 tablet (10 mg total) by mouth as needed. May repeat in 2 hours if needed 12 tablet 6   No current facility-administered medications for this visit.    PAST MEDICAL HISTORY: Past Medical History:  Diagnosis Date   Anxiety    Asthma    cellulitis 04/11/2022   right leg   GERD (gastroesophageal reflux disease)    Headache    History of PCOS    Hypertension    Hypothyroidism    2009-2012    PAST SURGICAL HISTORY: Past Surgical History:  Procedure Laterality Date   CESAREAN SECTION N/A 08/04/2017   Procedure: CESAREAN SECTION;  Surgeon: Thora Flint, MD;  Location: [facility];  Service: Obstetrics;  Laterality: N/A;   TYMPANOSTOMY TUBE PLACEMENT      FAMILY HISTORY: Family History  Problem Relation Age of Onset   Hypertension Mother    Hypertension Maternal Aunt    Diabetes Maternal Aunt    Hypertension Maternal Grandmother    Colon cancer Maternal Grandfather     SOCIAL HISTORY: Social History   Socioeconomic History   Marital status: Married    Spouse name: Not on file   Number of children: Not on file   Years of education: Not on file   Highest education level: Not on file  Occupational History   Not on file  Tobacco Use   Smoking status: Never   Smokeless tobacco: Never  Vaping Use   Vaping status: Never Used  Substance and Sexual Activity   Alcohol use: No   Drug use: No   Sexual activity: Yes    Birth control/protection: None  Other Topics Concern   Not on file  Social History Narrative   Not on file   Social Drivers of Health   Financial Resource Strain: Low Risk  (08/06/2022)    Received [facility]   Overall Financial Resource Strain (CARDIA)    Difficulty of Paying Living Expenses: Not hard at all  Food Insecurity: Not on file  Transportation Needs: Unknown (08/06/2022)   Received from [facility] - Transportation    Lack of Transportation (Medical): Not on file    Lack of Transportation (Non-Medical): No  Physical Activity: Not on file  Stress: No Stress Concern Present (08/06/2022)   Received from [facility] - Occupational Stress Questionnaire    Feeling of Stress : Only a little  Social Connections: Unknown (10/19/2021)   Received [facility]   Social Network    Social Network: Not on file  Intimate Partner Violence: Unknown (09/10/2021)   Received from [facility], [facility]   HITS    Physically Hurt: Not on file    Insult or Talk Down To: Not on file  Threaten Physical Harm: Not on file    Scream or Curse: Not on file      I personally spent a total of *** minutes in the care of the patient today including {Time Based Coding:210964241}.  Johny Nap, AGNP-BC  [facility] [address] [place], [place] [address]  Phone [phone] Fax [phone] Note: This document was prepared with digital dictation and possible smart phrase technology. Any transcriptional errors that result from this process are unintentional.

## 2023-11-16 ENCOUNTER — Telehealth: Payer: Commercial Managed Care - PPO | Admitting: Adult Health

## 2023-12-14 ENCOUNTER — Other Ambulatory Visit (HOSPITAL_COMMUNITY): Payer: Self-pay | Admitting: Family Medicine

## 2023-12-14 ENCOUNTER — Ambulatory Visit (HOSPITAL_COMMUNITY)
Admission: RE | Admit: 2023-12-14 | Discharge: 2023-12-14 | Disposition: A | Discharge status: Home / Self Care | Source: Ambulatory Visit | Attending: Vascular Surgery | Admitting: Vascular Surgery

## 2023-12-14 DIAGNOSIS — M79609 Pain in unspecified limb: Secondary | ICD-10-CM | POA: Insufficient documentation

## 2024-02-08 ENCOUNTER — Emergency Department (HOSPITAL_BASED_OUTPATIENT_CLINIC_OR_DEPARTMENT_OTHER): Admitting: Radiology

## 2024-02-08 ENCOUNTER — Other Ambulatory Visit: Payer: Self-pay

## 2024-02-08 ENCOUNTER — Emergency Department (HOSPITAL_BASED_OUTPATIENT_CLINIC_OR_DEPARTMENT_OTHER)
Admission: EM | Admit: 2024-02-08 | Discharge: 2024-02-08 | Disposition: A | Discharge status: Home / Self Care | Attending: Emergency Medicine | Admitting: Emergency Medicine

## 2024-02-08 DIAGNOSIS — Z79899 Other long term (current) drug therapy: Secondary | ICD-10-CM | POA: Insufficient documentation

## 2024-02-08 DIAGNOSIS — E039 Hypothyroidism, unspecified: Secondary | ICD-10-CM | POA: Insufficient documentation

## 2024-02-08 DIAGNOSIS — J40 Bronchitis, not specified as acute or chronic: Secondary | ICD-10-CM | POA: Diagnosis not present

## 2024-02-08 DIAGNOSIS — I10 Essential (primary) hypertension: Secondary | ICD-10-CM | POA: Diagnosis not present

## 2024-02-08 DIAGNOSIS — R0602 Shortness of breath: Secondary | ICD-10-CM | POA: Diagnosis present

## 2024-02-08 DIAGNOSIS — R112 Nausea with vomiting, unspecified: Secondary | ICD-10-CM | POA: Diagnosis not present

## 2024-02-08 DIAGNOSIS — R509 Fever, unspecified: Secondary | ICD-10-CM

## 2024-02-08 LAB — BASIC METABOLIC PANEL WITH GFR
Anion gap: 11 (ref 5–15)
BUN: 16 mg/dL (ref 6–20)
CO2: 23 mmol/L (ref 22–32)
Calcium: 9.4 mg/dL (ref 8.9–10.3)
Chloride: 103 mmol/L (ref 98–111)
Creatinine, Ser: 1.08 mg/dL — ABNORMAL HIGH (ref 0.44–1.00)
GFR, Estimated: >60 mL/min (ref >60)
Glucose, Bld: 102 mg/dL — ABNORMAL HIGH (ref 70–99)
Potassium: 4.1 mmol/L (ref 3.5–5.1)
Sodium: 136 mmol/L (ref 135–145)

## 2024-02-08 LAB — CBC WITH DIFFERENTIAL/PLATELET
Abs Immature Granulocytes: 0.01 K/uL (ref 0.00–0.07)
Basophils Absolute: 0 K/uL (ref 0.0–0.1)
Basophils Relative: 0 %
Eosinophils Absolute: 0.1 K/uL (ref 0.0–0.5)
Eosinophils Relative: 2 %
HCT: 38.2 % (ref 36.0–46.0)
Hemoglobin: 12.7 g/dL (ref 12.0–15.0)
Immature Granulocytes: 0 %
Lymphocytes Relative: 16 %
Lymphs Abs: 1 K/uL (ref 0.7–4.0)
MCH: 27.5 pg (ref 26.0–34.0)
MCHC: 33.2 g/dL (ref 30.0–36.0)
MCV: 82.7 fL (ref 80.0–100.0)
Monocytes Absolute: 0.6 K/uL (ref 0.1–1.0)
Monocytes Relative: 9 %
Neutro Abs: 4.7 K/uL (ref 1.7–7.7)
Neutrophils Relative %: 73 %
Platelets: 173 K/uL (ref 150–400)
RBC: 4.62 MIL/uL (ref 3.87–5.11)
RDW: 13.9 % (ref 11.5–15.5)
WBC: 6.5 K/uL (ref 4.0–10.5)
nRBC: 0 % (ref 0.0–0.2)

## 2024-02-08 LAB — RESP PANEL BY RT-PCR (RSV, FLU A&B, COVID)  RVPGX2
Influenza A by PCR: NEGATIVE
Influenza B by PCR: NEGATIVE
Resp Syncytial Virus by PCR: NEGATIVE
SARS Coronavirus 2 by RT PCR: NEGATIVE

## 2024-02-08 MED ORDER — ONDANSETRON 4 MG PO TBDP: 4.0000 mg | ORAL_TABLET | Freq: Four times a day (QID) | ORAL | 0 refills | Status: AC | PRN

## 2024-02-08 MED ORDER — ALBUTEROL SULFATE HFA 108 (90 BASE) MCG/ACT IN AERS
2.0000 | INHALATION_SPRAY | Freq: Once | RESPIRATORY_TRACT | Status: AC
  Administered 2024-02-08: 2 via RESPIRATORY_TRACT
  Filled 2024-02-08: qty 6.7

## 2024-02-08 MED ORDER — ONDANSETRON HCL 4 MG/2ML IJ SOLN
4.0000 mg | Freq: Once | INTRAMUSCULAR | Status: AC
  Administered 2024-02-08: 4 mg via INTRAVENOUS
  Filled 2024-02-08: qty 2

## 2024-02-08 MED ORDER — PREDNISONE 20 MG PO TABS: 60.0000 mg | ORAL_TABLET | Freq: Every day | ORAL | 0 refills | Status: AC

## 2024-02-08 MED ORDER — SODIUM CHLORIDE 0.9 % IV BOLUS
1000.0000 mL | Freq: Once | INTRAVENOUS | Status: AC
  Administered 2024-02-08: 1000 mL via INTRAVENOUS

## 2024-02-08 MED ORDER — ACETAMINOPHEN 500 MG PO TABS
1000.0000 mg | ORAL_TABLET | Freq: Once | ORAL | Status: AC
  Administered 2024-02-08: 1000 mg via ORAL
  Filled 2024-02-08: qty 2

## 2024-02-08 MED ORDER — IBUPROFEN 400 MG PO TABS
600.0000 mg | ORAL_TABLET | Freq: Once | ORAL | Status: AC
  Administered 2024-02-08: 600 mg via ORAL
  Filled 2024-02-08: qty 1

## 2024-02-08 MED ORDER — IPRATROPIUM-ALBUTEROL 0.5-2.5 (3) MG/3ML IN SOLN
3.0000 mL | RESPIRATORY_TRACT | Status: DC
  Administered 2024-02-08: 3 mL via RESPIRATORY_TRACT
  Filled 2024-02-08: qty 3

## 2024-02-08 MED ORDER — DEXAMETHASONE SODIUM PHOSPHATE 10 MG/ML IJ SOLN
10.0000 mg | Freq: Once | INTRAMUSCULAR | Status: AC
  Administered 2024-02-08: 10 mg via INTRAMUSCULAR
  Filled 2024-02-08: qty 1

## 2024-02-08 MED ORDER — BENZONATATE 100 MG PO CAPS
200.0000 mg | ORAL_CAPSULE | Freq: Once | ORAL | Status: AC
  Administered 2024-02-08: 200 mg via ORAL
  Filled 2024-02-08: qty 2

## 2024-02-08 MED ORDER — AZITHROMYCIN 250 MG PO TABS
250.0000 mg | ORAL_TABLET | Freq: Every day | ORAL | 0 refills | Status: AC
Start: 2024-02-08 — End: ?

## 2024-02-08 MED ORDER — BENZONATATE 200 MG PO CAPS
200.0000 mg | ORAL_CAPSULE | Freq: Three times a day (TID) | ORAL | 0 refills | Status: AC | PRN
Start: 2024-02-08 — End: ?

## 2024-02-08 NOTE — ED Provider Notes (Signed)
 [place] EMERGENCY DEPARTMENT AT [facility] Provider Note   CSN: [id number] Arrival date & time: 02/08/24  1041     Patient presents with: Fever   Desiree Singh is a 43 y.o. female.   Desiree Singh is a 43 y.o. female with a history of asthma, hypertension, hypothyroidism and anxiety, who presents to the emergency department for evaluation of cough, fever and shortness of breath. Symptoms began 4 days ago with fever, body aches, fatigue, congestion.  Cough started yesterday.  With it she reports feeling short of breath primarily when she is up and walking.  She reports cough is occasionally productive of mucus.  She denies chest pain but does report soreness with coughing.  No lightheadedness or syncope.  Also reports some nausea and vomiting over the past few days with decreased oral intake.  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  She reports a history of frequent episodes of bronchitis and pneumonia.   The history is provided by the patient, the spouse and medical records.  Fever Associated symptoms: chills, congestion, myalgias, nausea, rhinorrhea and vomiting   Associated symptoms: no chest pain, no headaches and no sore throat        Prior to Admission medications   Medication Sig Start Date End Date Taking? Authorizing Provider  azithromycin  (ZITHROMAX ) 250 MG tablet Take 1 tablet (250 mg total) by mouth daily. Take first 2 tablets together, then 1 every day until finished. 02/08/24  Yes Alva Larraine FALCON, PA-C  benzonatate  (TESSALON ) 200 MG capsule Take 1 capsule (200 mg total) by mouth 3 (three) times daily as needed for cough. 02/08/24  Yes Galdino Hinchman F, PA-C  ondansetron  (ZOFRAN -ODT) 4 MG disintegrating tablet Take 1 tablet (4 mg total) by mouth every 6 (six) hours as needed for nausea or vomiting. 02/08/24  Yes  Alva Larraine FALCON, PA-C  OZEMPIC, 0.25 OR 0.5 MG/DOSE, 2 MG/3ML SOPN  02/06/24  Yes [provider]  predniSONE  (DELTASONE ) 20 MG tablet Take 3 tablets (60 mg total) by mouth daily for 5 days. 02/08/24 02/13/24 Yes Alva Larraine FALCON, PA-C  albuterol  (VENTOLIN  HFA) 108 (90 Base) MCG/ACT inhaler Inhale 2 puffs into the lungs every 6 (six) hours as needed for wheezing or shortness of breath.    [provider]  hydrochlorothiazide  (HYDRODIURIL ) 12.5 MG tablet Take 12.5 mg by mouth every morning. 05/02/23   [provider]  hydrochlorothiazide  (HYDRODIURIL ) 25 MG tablet Take 25 mg by mouth daily.    [provider]  labetalol  (NORMODYNE ) 200 MG tablet Take 200 mg by mouth 2 (two) times daily.    [provider]  rizatriptan  (MAXALT -MLT) 10 MG disintegrating tablet Take 1 tablet (10 mg total) by mouth as needed. May repeat in 2 hours if needed 05/18/23   Onita Duos, MD    Allergies: Flexeril  [cyclobenzaprine ] and Amoxicillin    Review of Systems  Constitutional:  Positive for chills and fever.  HENT:  Positive for congestion and rhinorrhea. Negative for sore throat.   Respiratory:  Positive for shortness of breath and wheezing.   Cardiovascular:  Negative for chest pain.  Gastrointestinal:  Positive for nausea and vomiting. Negative for abdominal pain.  Musculoskeletal:  Positive for myalgias. Negative for neck pain and neck stiffness.  Neurological:  Negative for syncope and headaches.    Updated Vital Signs BP 135/87   Pulse 89  Temp 98.8 F (37.1 C)   Resp 16   SpO2 98%   Physical Exam Vitals and nursing note reviewed.  Constitutional:      General: She is not in acute distress.    Appearance: Normal appearance. She is well-developed. She is not ill-appearing or diaphoretic.  HENT:     Head: Normocephalic and atraumatic.     Nose: Congestion and rhinorrhea present.     Mouth/Throat:     Mouth: Mucous membranes are moist.     Pharynx:  Oropharynx is clear. No oropharyngeal exudate or posterior oropharyngeal erythema.  Eyes:     General:        Right eye: No discharge.        Left eye: No discharge.  Neck:     Comments: No rigidity Cardiovascular:     Rate and Rhythm: Normal rate and regular rhythm.     Heart sounds: Normal heart sounds. No murmur heard.    No friction rub. No gallop.  Pulmonary:     Effort: Pulmonary effort is normal. No respiratory distress.     Breath sounds: Normal breath sounds.     Comments: Respirations equal and unlabored, patient able to speak in full sentences, lungs clear to auscultation bilaterally  Abdominal:     General: Bowel sounds are normal. There is no distension.     Palpations: Abdomen is soft. There is no mass.     Tenderness: There is no abdominal tenderness. There is no guarding.     Comments: Abdomen soft, nondistended, nontender to palpation in all quadrants without guarding or peritoneal signs  Musculoskeletal:        General: No deformity.     Cervical back: Neck supple.  Lymphadenopathy:     Cervical: No cervical adenopathy.  Skin:    General: Skin is warm and dry.     Capillary Refill: Capillary refill takes less than 2 seconds.  Neurological:     Mental Status: She is alert and oriented to person, place, and time.  Psychiatric:        Mood and Affect: Mood normal.        Behavior: Behavior normal.     (all labs ordered are listed, but only abnormal results are displayed) Labs Reviewed  BASIC METABOLIC PANEL WITH GFR - Abnormal; Notable for the following components:      Result Value   Glucose, Bld 102 (*)    Creatinine, Ser 1.08 (*)    All other components within normal limits  RESP PANEL BY RT-PCR (RSV, FLU A&B, COVID)  [id number]  CBC WITH DIFFERENTIAL/PLATELET    EKG: None  Radiology: DG Chest 2 View Result Date: 02/08/2024 CLINICAL DATA:  Cough and fever. EXAM: CHEST - 2 VIEW COMPARISON:  02/16/2022 FINDINGS: Normal-sized heart. Clear lungs with  normal vascularity. Thoracic spine degenerative changes with changes of DISH. IMPRESSION: No acute abnormality. Electronically Signed   By: Elspeth Bathe M.D.   On: 02/08/2024 11:26     Procedures   Medications Ordered in the ED  acetaminophen  (TYLENOL ) tablet 1,000 mg (1,000 mg Oral Given 02/08/24 1145)  dexamethasone  (DECADRON ) injection 10 mg (10 mg Intramuscular Given 02/08/24 1147)  ibuprofen  (ADVIL ) tablet 600 mg (600 mg Oral Given 02/08/24 1235)  benzonatate  (TESSALON ) capsule 200 mg (200 mg Oral Given 02/08/24 1236)  sodium chloride  0.9 % bolus 1,000 mL (0 mLs Intravenous Stopped 02/08/24 1404)  ondansetron  (ZOFRAN ) injection 4 mg (4 mg Intravenous Given 02/08/24 1303)  albuterol  (VENTOLIN  HFA) 108 (90  Base) MCG/ACT inhaler 2 puff (2 puffs Inhalation Given 02/08/24 1505)                                    Medical Decision Making Amount and/or Complexity of Data Reviewed Labs: ordered. Radiology: ordered.  Risk OTC drugs. Prescription drug management.   Patient presents with cough, congestion, fever for 4 days.  On arrival has low-grade temperature but vitals are normal.  Despite reported shortness of breath patient maintains normal O2 sats and lungs are clear.  No increased work of breathing.  Patient does have frequent persistent coughing.  Differential includes, COVID, flu or other viral respiratory illness, pneumonia, bronchitis, asthma exacerbation.  Patient treated with Tylenol , Decadron , DuoNeb and Tessalon  Perles as well as IV fluid bolus and Zofran .  CBC and BMP unremarkable, COVID, flu and RSV panel is negative, suspect other viral etiology for this respiratory illness.  Chest x-ray clear without evidence of pneumonia or other cardiopulmonary disease.  After medications patient is feeling better, lungs remain clear, 100%.  Will discharge home with continued symptomatic treatment, provided inhaler in the ED and given prescription for prednisone  Zofran  and Tessalon  Perles.   Given 4 days of fever will prescribe azithromycin  for patient to take if fever does not resolve in the next 1 to 2 days.      Final diagnoses:  Bronchitis  Fever, unspecified fever cause  Nausea and vomiting, unspecified vomiting type    ED Discharge Orders          Ordered    ondansetron  (ZOFRAN -ODT) 4 MG disintegrating tablet  Every 6 hours PRN        02/08/24 1450    predniSONE  (DELTASONE ) 20 MG tablet  Daily        02/08/24 1450    benzonatate  (TESSALON ) 200 MG capsule  3 times daily PRN        02/08/24 1450    azithromycin  (ZITHROMAX ) 250 MG tablet  Daily        02/08/24 1452               Alva Larraine FALCON, PA-C 02/13/24 2119    Pamella Ozell LABOR, DO 02/22/24 [phone]

## 2024-02-08 NOTE — ED Notes (Signed)
 Patient transported to X-ray

## 2024-02-08 NOTE — ED Notes (Signed)

## 2024-02-08 NOTE — Discharge Instructions (Signed)
 Your COVID and flu test were negative today suspect you have another viral illness leading to fevers cough and bodyaches.  If fevers have not resolved in the next 2 to 3 days please start prescribed antibiotics.  To treat fever use ibuprofen  600 mg and Tylenol  650 mg every 6 hours these can be alternated so they are taken every 3 hours so that they do not wear off at the same time.  To help with cough and airway inflammation take prednisone  once daily for the next 5 days starting tomorrow morning.  Use albuterol  inhaler as needed.  Use prescribed Tessalon  Perles as needed for cough and Zofran  as needed for nausea and vomiting.  Drink plenty of fluids.  Follow-up closely with your primary care provider and return if you have new or worsening symptoms.

## 2024-02-08 NOTE — ED Triage Notes (Addendum)
 Patient reports fever for 5 days with shortness of breath that began last night. Cough for 2 days. Took 800mg  ibuprofen  pta
